# Patient Record
Sex: Male | Born: 1955 | Race: Black or African American | Hispanic: No | Marital: Married | Smoking: Never smoker
Health system: Southern US, Community
[De-identification: ages and names within clinical notes are randomized; demographics above are authoritative.]

## PROBLEM LIST (undated history)

## (undated) DIAGNOSIS — I10 Essential (primary) hypertension: Secondary | ICD-10-CM

## (undated) HISTORY — PX: HERNIA REPAIR: SHX51

## (undated) HISTORY — DX: Essential (primary) hypertension: I10

## (undated) HISTORY — PX: TOOTH EXTRACTION: SUR596

---

## 1998-07-28 ENCOUNTER — Emergency Department (HOSPITAL_COMMUNITY): Admission: EM | Admit: 1998-07-28 | Discharge: 1998-07-28 | Payer: Self-pay | Admitting: Emergency Medicine

## 1998-09-04 ENCOUNTER — Ambulatory Visit (HOSPITAL_COMMUNITY): Admission: RE | Admit: 1998-09-04 | Discharge: 1998-09-04 | Payer: Self-pay | Admitting: General Surgery

## 1999-08-16 ENCOUNTER — Encounter: Payer: Self-pay | Admitting: Family Medicine

## 1999-08-16 ENCOUNTER — Ambulatory Visit (HOSPITAL_COMMUNITY): Admission: RE | Admit: 1999-08-16 | Discharge: 1999-08-16 | Payer: Self-pay | Admitting: Family Medicine

## 2000-04-03 ENCOUNTER — Ambulatory Visit (HOSPITAL_COMMUNITY): Admission: RE | Admit: 2000-04-03 | Discharge: 2000-04-03 | Payer: Self-pay | Admitting: Family Medicine

## 2000-04-03 ENCOUNTER — Encounter: Payer: Self-pay | Admitting: Family Medicine

## 2010-03-01 ENCOUNTER — Encounter: Admission: RE | Admit: 2010-03-01 | Discharge: 2010-03-01 | Payer: Self-pay | Admitting: Family Medicine

## 2011-02-22 IMAGING — CR DG KNEE COMPLETE 4+V*R*
4 series · 4 of 4 positions shown · non-contrast
Comparison: None.

CLINICAL DATA: Knee pain, no acute injury

RIGHT KNEE - COMPLETE 4+ VIEW

[view not recorded (1 of 4)]
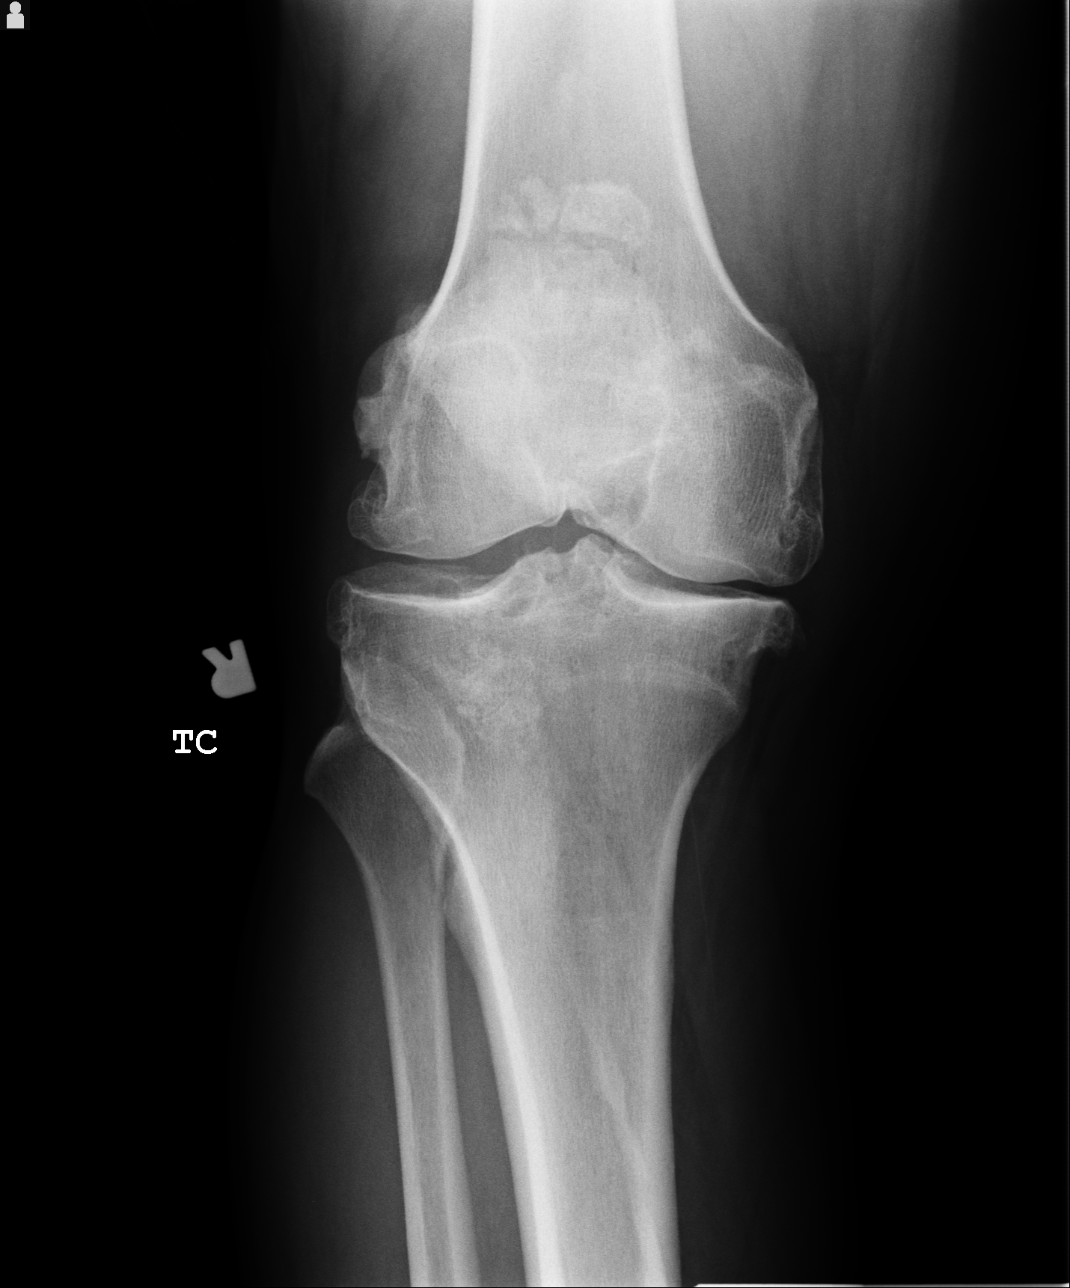

[view not recorded (2 of 4)]
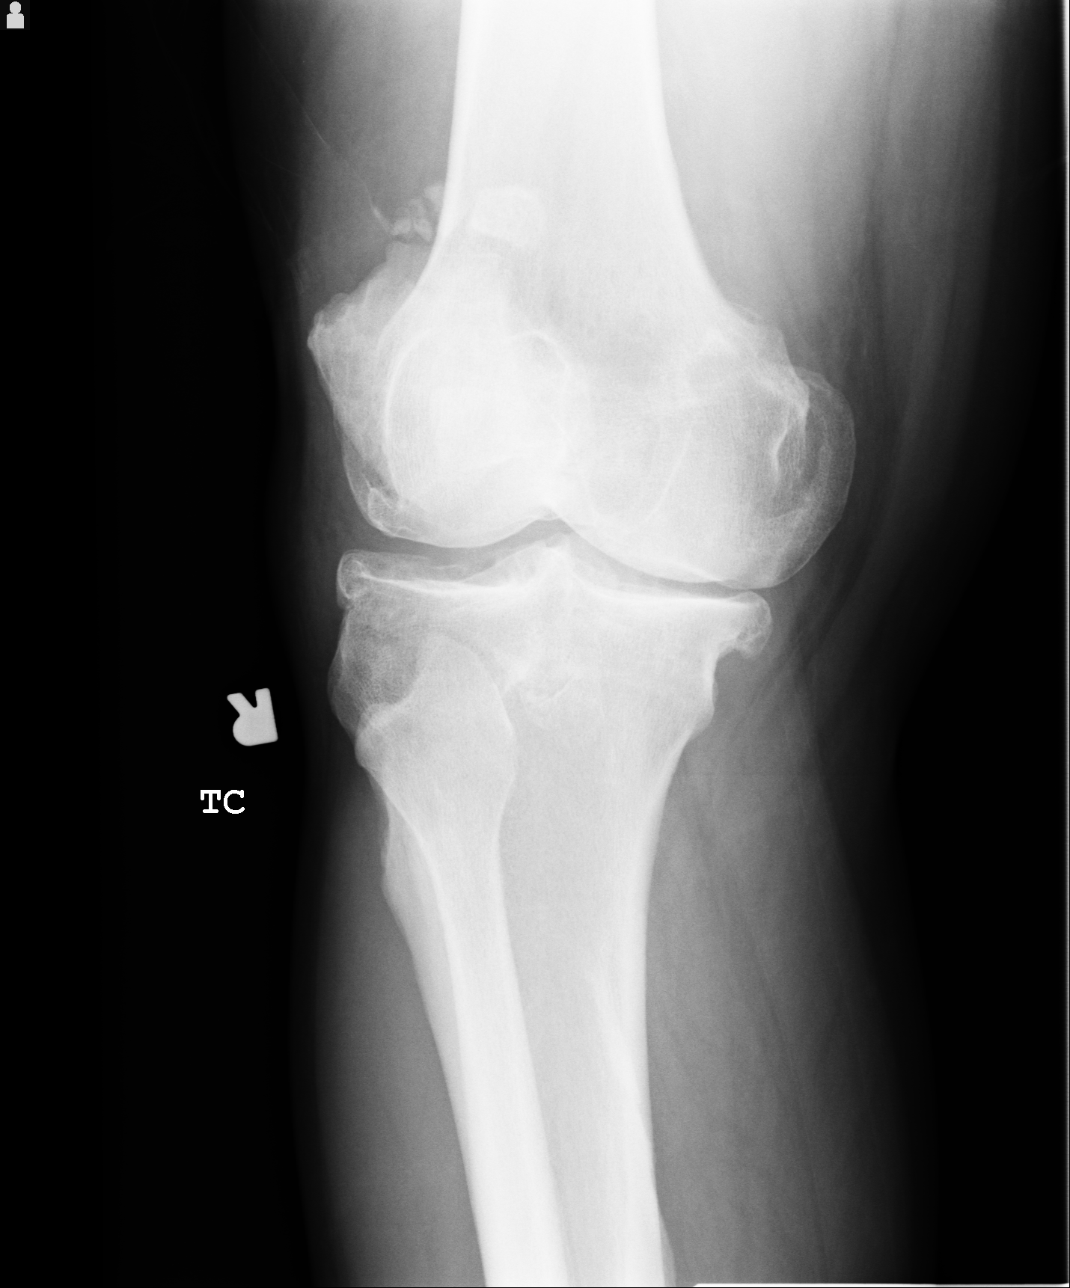

[view not recorded (3 of 4)]
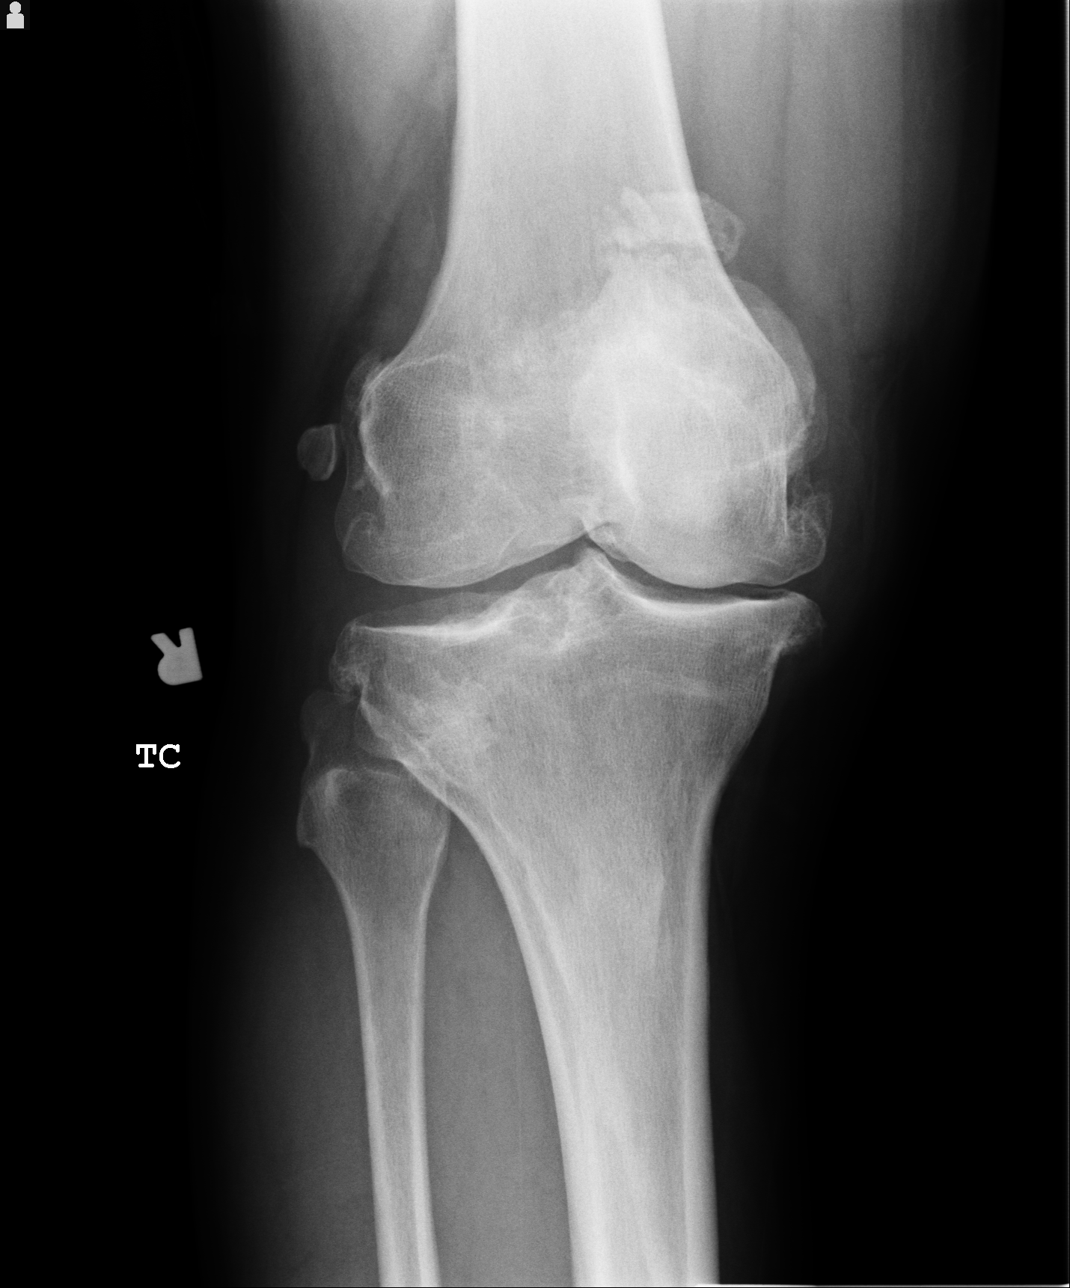

[view not recorded (4 of 4)]
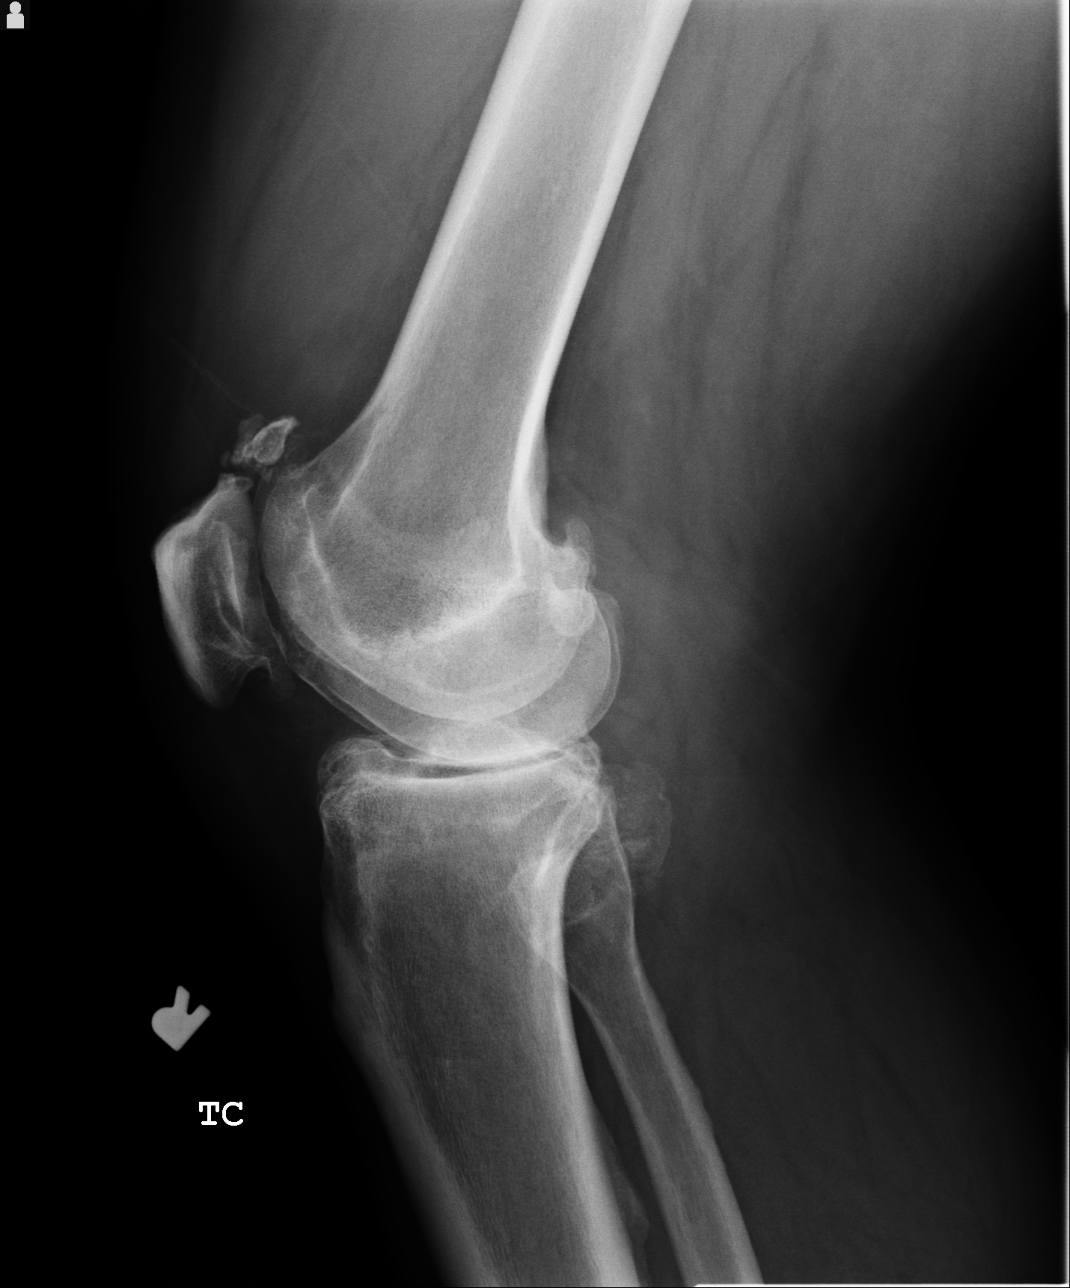

[4 of 4 positions shown; findings below may reference images not displayed]

FINDINGS: There is some degenerative joint disease particularly
involving the medial and patellofemoral compartments with loss of
joint space, sclerosis, and spurring.  No acute fracture is seen
and no definite joint effusion is noted.  Well corticated bony
densities lie just above the patella most likely due to prior
injury.
IMPRESSION: Bicompartmental degenerative joint disease.  No acute abnormality.

## 2014-05-16 ENCOUNTER — Encounter: Payer: Self-pay | Admitting: Internal Medicine

## 2014-08-02 ENCOUNTER — Encounter: Payer: Self-pay | Admitting: Internal Medicine

## 2018-11-09 ENCOUNTER — Ambulatory Visit (INDEPENDENT_AMBULATORY_CARE_PROVIDER_SITE_OTHER): Payer: PRIVATE HEALTH INSURANCE | Admitting: Neurology

## 2018-11-09 ENCOUNTER — Encounter: Payer: Self-pay | Admitting: Neurology

## 2018-11-09 DIAGNOSIS — G500 Trigeminal neuralgia: Secondary | ICD-10-CM

## 2018-11-09 MED ORDER — CARBAMAZEPINE 200 MG PO TABS: ORAL_TABLET | ORAL | 5 refills | Status: DC

## 2018-11-09 NOTE — Patient Instructions (Addendum)
You may have a condition called: Trigeminal Neuralgia, which is a facial pain condition, due to nerve irritation/inflammation.  We will do a brain scan, called MRI with special attention to the trigeminal nerve. We call you with the test results. We will have to schedule you for this on a separate date. This test requires authorization from your insurance, and we will take care of the insurance process.  We will check blood work today and call you with the test results.  We will start you on a medication called: Tegretol (carbamazepine) 200 mg strength: take 1/2 pill twice daily for 1 week, then 1 pill twice daily thereafter.  Side effects include sleepiness, daytime grogginess, balance problems, dizziness.   Thankfully, your neurological exam otherwise is fine.

## 2018-11-09 NOTE — Progress Notes (Signed)
Subjective:    Patient ID: Gary Prince is a 62 y.o. male.  HPI     [name], MD, PhD [facility] [address], [address] [address] [place], [place] [address]  Dear Dr. Ellery Plunk,   I saw your patient, Gary Prince, upon your kind request, in my neurologic clinic today for initial consultation of his left-sided facial pain. The patient is unaccompanied today. As you know, Gary Prince is a 62 year old right-handed gentleman with an underlying medical history of hypertension and mildly overweight state, who reports left upper facial pain for the past 2 months or so. He had a left upper maxilla molar tooth extraction under his dentist in September 2019. After his local anesthetic wore off he started having sharp pain in the left upper face and jaw area. Triggers included touching the area. He went back to his dentist and was treated symptomatically with ibuprofen 800 mg strength. He also had an additional procedure done to the underlying bone. His pain returned, was not as severe as originally. Nevertheless, he has ongoing sharp and shooting pains in the left mid face area, seems to radiate sideways towards the ear and upwards. Triggers include touch, toothbrushing, sometimes talking and chewing. He's had a course of steroid 62-year-old office. He did not find relief from this. The pain is intermittent, not constant. He has other teeth that need attention secondary to caries, he was told that there was no underlying bone disease or infection per x-rays. He does not have a history of headaches, he has no other neurological accompaniments. He is married and lives with his wife, he works at a daycare. He does not smoke, drinks alcohol about 8 drinks per week on average, drinks caffeine in the form of coffee, one cup per day on average. He tries to stay hydrated with water. He has a history of chronic knee pain from playing sports in high school. He has never seen an orthopedic specialist  for this.  His Past Medical History Is Significant For: Past Medical History:  Diagnosis Date  . Hypertension    His Past Surgical History Is Significant For:  His Family History Is Significant For: Family History  Problem Relation Age of Onset  . Stroke Mother   . Stroke Father     His Social History Is Significant For: Social History   Socioeconomic History  . Marital status: Married    Spouse name: Not on file  . Number of children: Not on file  . Years of education: Not on file  . Highest education level: Not on file  Occupational History  . Not on file  Social Needs  . Financial resource strain: Not on file  . Food insecurity:    Worry: Not on file    Inability: Not on file  . Transportation needs:    Medical: Not on file    Non-medical: Not on file  Tobacco Use  . Smoking status: Never Smoker  . Smokeless tobacco: Never Used  Substance and Sexual Activity  . Alcohol use: Yes    Alcohol/week: 8.0 standard drinks    Types: 8 Standard drinks or equivalent per week  . Drug use: Not on file  . Sexual activity: Not on file  Lifestyle  . Physical activity:    Days per week: Not on file    Minutes per session: Not on file  . Stress: Not on file  Relationships  . Social connections:    Talks on phone: Not on  file    Gets together: Not on file    Attends religious service: Not on file    Active member of club or organization: Not on file    Attends meetings of clubs or organizations: Not on file    Relationship status: Not on file  Other Topics Concern  . Not on file  Social History Narrative   1 CUP OF COFFEE DAILY   RIGHT HANDED    His Allergies Are:  No Known Allergies:   His Current Medications Are:  Outpatient Encounter Medications as of 11/09/2018  Medication Sig  . terbinafine (LAMISIL) 250 MG tablet Take 250 mg by mouth daily.  . traZODone (DESYREL) 50 MG tablet Take 50 mg by mouth daily.  . valsartan-hydrochlorothiazide (DIOVAN-HCT) 320-25  MG tablet Take 1 tablet by mouth daily.   No facility-administered encounter medications on file as of 11/09/2018.   :   Review of Systems:  Out of a complete 14 point review of systems, all are reviewed and negative with the exception of these symptoms as listed below: Review of Systems  Neurological:       Pt presents today to discuss his left facial pain following a tooth extraction 2 months ago. He has tried steroids and pain medication which has not helped.    Objective:  Neurological Exam  Physical Exam Physical Examination:   Vitals:   11/09/18 0824  BP: 126/84  Pulse: 77    General Examination: The patient is a very pleasant 62 y.o. male in no acute distress. He appears well-developed and well-nourished and well groomed.   HEENT: Normocephalic, atraumatic, pupils are equal, round and reactive to light and accommodation. xtraocular tracking is good without limitation to gaze excursion or nystagmus noted. Normal smooth pursuit is noted. Hearing is grossly intact. Tympanic membranes are clear bilaterally. Face is symmetric with normal facial animation and normal facial sensation , with the exception of slightly reduced temperature sense in the left mid face area. He triggered her pain sensation by tapping his face in front of the left ear. Speech is clear with no dysarthria noted. There is no hypophonia. There is no lip, neck/head, jaw or voice tremor. Neck is supple with full range of passive and active motion. There are no carotid bruits on auscultation. Oropharynx exam reveals: mild mouth dryness, marginal dental hygiene with several teeth missing and dental caries, well-healing area of tooth extraction left upper maxilla. Mallampati is class II. Tongue protrudes centrally and palate elevates symmetrically. Tonsils are absent.   Chest: Clear to auscultation without wheezing, rhonchi or crackles noted.  Heart: S1+S2+0, regular and normal without murmurs, rubs or gallops noted.    Abdomen: Soft, non-tender and non-distended with normal bowel sounds appreciated on auscultation.  Extremities: There is no pitting edema in the distal lower extremities bilaterally. Pedal pulses are intact.  Skin: Warm and dry without trophic changes noted.  Musculoskeletal: exam reveals no obvious joint deformities, tenderness or joint swelling or erythema.   Neurologically:  Mental status: The patient is awake, alert and oriented in all 4 spheres. His immediate and remote memory, attention, language skills and fund of knowledge are appropriate. There is no evidence of aphasia, agnosia, apraxia or anomia. Speech is clear with normal prosody and enunciation. Thought process is linear. Mood is normal and affect is normal.  Cranial nerves II - XII are as described above under HEENT exam. In addition: shoulder shrug is normal with equal shoulder height noted. Motor exam: Normal bulk, strength and tone  is noted. There is no drift, tremor or rebound. Romberg is negative. Reflexes are 2+ throughout. Fine motor skills and coordination: intact grossly.  Cerebellar testing: No dysmetria or intention tremor.  Sensory exam: intact to all modalities in the upper and lower extremities. Gait, station and balance: He stands easily. No veering to one side is noted. No leaning to one side is noted. Posture is age-appropriate and stance is narrow based. Gait shows normal stride length and normal pace. No problems turning are noted. Tandem walk is unremarkable.   Assessment and Plan:   In summary, Gary Prince is a very pleasant 62 y.o.-year old male with an underlying medical history of hypertension and mildly overweight state, who presents for evaluation of his intermittent left facial pain. 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 His neurological exam otherwise is nonfocal and he is reassured in that regard. I suggested that we proceed with a brain MRI with special attention  to the trigeminal nerve area to rule out any structural cause of his symptoms. I would like to also do some blood work to look for kidney, liver function, cell count, inflammatory markers etc. I would like for symptomatic treatment to start Tegretol low-dose 200 mg strength generic half a pill twice a day for the first week and then 1 pill twice a day thereafter. We talked about side effects, expectations and limitations of the medication. We will call him with his blood test results as well as scan results and I would like to see him back in 3 months, sooner if needed. He is encouraged to call with any interim questions or concerns you may have. I answered all his questions today and he was in agreement. Thank you very much for allowing me to participate in the care of this nice patient. If I can be of any further assistance to you please do not hesitate to call me at [phone].  Sincerely,   Huston FoleySaima Nathasha Fiorillo, MD, PhD

## 2018-11-10 ENCOUNTER — Telehealth: Payer: Self-pay

## 2018-11-10 LAB — CBC WITH DIFFERENTIAL/PLATELET
BASOS ABS: 0 10*3/uL (ref 0.0–0.2)
Basos: 0 %
EOS (ABSOLUTE): 0.2 10*3/uL (ref 0.0–0.4)
Eos: 2 %
HEMOGLOBIN: 15.5 g/dL (ref 13.0–17.7)
Hematocrit: 47.1 % (ref 37.5–51.0)
IMMATURE GRANS (ABS): 0.1 10*3/uL (ref 0.0–0.1)
Immature Granulocytes: 1 %
Lymphocytes Absolute: 1.5 10*3/uL (ref 0.7–3.1)
Lymphs: 18 %
MCH: 28.5 pg (ref 26.6–33.0)
MCHC: 32.9 g/dL (ref 31.5–35.7)
MCV: 87 fL (ref 79–97)
MONOCYTES: 8 %
Monocytes Absolute: 0.6 10*3/uL (ref 0.1–0.9)
Neutrophils Absolute: 5.9 10*3/uL (ref 1.4–7.0)
Neutrophils: 71 %
Platelets: 228 10*3/uL (ref 150–450)
RBC: 5.44 x10E6/uL (ref 4.14–5.80)
RDW: 13.6 % (ref 12.3–15.4)
WBC: 8.2 10*3/uL (ref 3.4–10.8)

## 2018-11-10 LAB — COMPREHENSIVE METABOLIC PANEL
ALT: 14 IU/L (ref 0–44)
AST: 13 IU/L (ref 0–40)
Albumin/Globulin Ratio: 1.9 (ref 1.2–2.2)
Albumin: 4.4 g/dL (ref 3.6–4.8)
Alkaline Phosphatase: 54 IU/L (ref 39–117)
BUN/Creatinine Ratio: 14 (ref 10–24)
BUN: 16 mg/dL (ref 8–27)
Bilirubin Total: 0.2 mg/dL (ref 0.0–1.2)
CO2: 26 mmol/L (ref 20–29)
Calcium: 9.7 mg/dL (ref 8.6–10.2)
Chloride: 99 mmol/L (ref 96–106)
Creatinine, Ser: 1.14 mg/dL (ref 0.76–1.27)
GFR calc Af Amer: 78 mL/min/{1.73_m2} (ref >59)
GFR calc non Af Amer: 68 mL/min/{1.73_m2} (ref >59)
Globulin, Total: 2.3 g/dL (ref 1.5–4.5)
Glucose: 74 mg/dL (ref 65–99)
Potassium: 4.3 mmol/L (ref 3.5–5.2)
Sodium: 140 mmol/L (ref 134–144)
Total Protein: 6.7 g/dL (ref 6.0–8.5)

## 2018-11-10 LAB — SEDIMENTATION RATE: Sed Rate: 8 mm/hr (ref 0–30)

## 2018-11-10 LAB — HGB A1C W/O EAG: Hgb A1c MFr Bld: 5.8 % — ABNORMAL HIGH (ref 4.8–5.6)

## 2018-11-10 LAB — C-REACTIVE PROTEIN: CRP: 1 mg/L (ref 0–10)

## 2018-11-10 NOTE — Progress Notes (Signed)
Labs okay, A1c is in the pre-diabetes range. Watching carbs and reducing sugar intake is recommended. Will proceed with MRI brain/trigem nerve. Please update pt. Gary Prince

## 2018-11-10 NOTE — Telephone Encounter (Signed)
I called pt and advised him of his lab results. Pt will monitor his carbs and reduce his sugar intake. Pt will be called to schedule his MRI. Pt verbalized understanding of results. Pt had no questions at this time but was encouraged to call back if questions arise.

## 2018-11-10 NOTE — Telephone Encounter (Signed)
-----   Message from Huston FoleySaima Athar, MD sent at 11/10/2018  7:44 AM EST ----- Labs okay, A1c is in the pre-diabetes range. Watching carbs and reducing sugar intake is recommended. Will proceed with MRI brain/trigem nerve. Please update pt. Gary Prince

## 2018-11-16 ENCOUNTER — Telehealth: Payer: Self-pay | Admitting: Neurology

## 2018-11-16 NOTE — Telephone Encounter (Signed)
[employer]ann [employer] auth:  it has been approved but they will not release the auth # becasue the inform department is trying to reach out to the patient to inform the patient where the cheapest places is for him to go to have the MRI. Right now the location for the MRI is [facility].

## 2019-02-15 ENCOUNTER — Ambulatory Visit (INDEPENDENT_AMBULATORY_CARE_PROVIDER_SITE_OTHER): Payer: PRIVATE HEALTH INSURANCE | Admitting: Neurology

## 2019-02-15 ENCOUNTER — Other Ambulatory Visit: Payer: Self-pay

## 2019-02-15 ENCOUNTER — Encounter: Payer: Self-pay | Admitting: Neurology

## 2019-02-15 VITALS — BP 138/91 | HR 82 | Ht 70.5 in | Wt 218.0 lb

## 2019-02-15 DIAGNOSIS — G500 Trigeminal neuralgia: Secondary | ICD-10-CM | POA: Diagnosis not present

## 2019-02-15 MED ORDER — CARBAMAZEPINE 200 MG PO TABS: 200.0000 mg | ORAL_TABLET | Freq: Two times a day (BID) | ORAL | 3 refills | Status: DC

## 2019-02-15 NOTE — Patient Instructions (Signed)
We will check blood work today and call you with the results.  We can continue with the generic tegretol 200 mg twice daily.  We will try to pursue the facial MRI and call you.  Please follow up in 6 months with Butch Penny, NP.

## 2019-02-15 NOTE — Progress Notes (Signed)
Subjective:    Patient ID: Gary Prince is a 63 y.o. male.  HPI     Interim history:   Gary Prince is a 63 year old right-handed gentleman with an underlying medical history of hypertension and mildly overweight state, who presents for follow-up consultation of his left-sided facial pain, concern for trigeminal neuralgia. The patient is unaccompanied today. I first met him on 11/09/2018 at the request of his dentist, at which time the patient reported left upper facial pain for the past 2 months or so. He had a recent left upper molar extraction in September 2019. I suggested we proceed with blood work, a trigeminal MRI and a trial of Tegretol. Blood work showed benign findings, including CRP, CBC, ESR, CMP, A1c however was borderline at 5.8. We talked to him about his blood test results via phone call. He did not proceed with a trigeminal MRI.  Today, 02/15/2019: He reports that he could not afford the MRI. He would be willing to try to pursue it at this time. He had a flareup in his left facial pain in or around Christmas. He has been taking Tegretol 200 mg twice a day generic. He reports no significant side effects but has noted a little bit of tiredness from it. He reports no issues driving. He avoids long-distance driving. He is supposed to see his dentist for additional dental work as I understand. Other than that, he has done well with the carbamazepine. He would like to continue with it.   The patient's allergies, current medications, family history, past medical history, past social history, past surgical history and problem list were reviewed and updated as appropriate.   Previously:   11/09/2018: (He) reports left upper facial pain for the past 2 months or so. He had a left upper maxilla molar tooth extraction under his dentist in September 2019. After his local anesthetic wore off he started having sharp pain in the left upper face and jaw area. Triggers included touching the area. He  went back to his dentist and was treated symptomatically with ibuprofen 800 mg strength. He also had an additional procedure done to the underlying bone. His pain returned, was not as severe as originally. Nevertheless, he has ongoing sharp and shooting pains in the left mid face area, seems to radiate sideways towards the ear and upwards. Triggers include touch, toothbrushing, sometimes talking and chewing. He's had a course of steroid 1-year-old office. He did not find relief from this. The pain is intermittent, not constant. He has other teeth that need attention secondary to caries, he was told that there was no underlying bone disease or infection per x-rays. He does not have a history of headaches, he has no other neurological accompaniments. He is married and lives with his wife, he works at a daycare. He does not smoke, drinks alcohol about 8 drinks per week on average, drinks caffeine in the form of coffee, one cup per day on average. He tries to stay hydrated with water. He has a history of chronic knee pain from playing sports in high school. He has never seen an orthopedic specialist for this.  His Past Medical History Is Significant For: Past Medical History:  Diagnosis Date  . Hypertension    His Past Surgical History Is Significant For:   His Family History Is Significant For: Family History  Problem Relation Age of Onset  . Stroke Mother   . Stroke Father     His Social History Is Significant For: Social History  Socioeconomic History  . Marital status: Married    Spouse name: Not on file  . Number of children: Not on file  . Years of education: Not on file  . Highest education level: Not on file  Occupational History  . Not on file  Social Needs  . Financial resource strain: Not on file  . Food insecurity:    Worry: Not on file    Inability: Not on file  . Transportation needs:    Medical: Not on file    Non-medical: Not on file  Tobacco Use  . Smoking status:  Never Smoker  . Smokeless tobacco: Never Used  Substance and Sexual Activity  . Alcohol use: Yes    Alcohol/week: 8.0 standard drinks    Types: 8 Standard drinks or equivalent per week  . Drug use: Not on file  . Sexual activity: Not on file  Lifestyle  . Physical activity:    Days per week: Not on file    Minutes per session: Not on file  . Stress: Not on file  Relationships  . Social connections:    Talks on phone: Not on file    Gets together: Not on file    Attends religious service: Not on file    Active member of club or organization: Not on file    Attends meetings of clubs or organizations: Not on file    Relationship status: Not on file  Other Topics Concern  . Not on file  Social History Narrative   1 CUP OF COFFEE DAILY   RIGHT HANDED    His Allergies Are:  No Known Allergies:   His Current Medications Are:  Outpatient Encounter Medications as of 02/15/2019  Medication Sig  . carbamazepine (TEGRETOL) 200 MG tablet 1/2 pill twice daily for 1 week, then 1 pill twice daily thereafter. (Patient taking differently: Take 200 mg by mouth 2 (two) times daily. 1/2 pill twice daily for 1 week, then 1 pill twice daily thereafter.)  . terbinafine (LAMISIL) 250 MG tablet Take 250 mg by mouth daily.  . traZODone (DESYREL) 50 MG tablet Take 50 mg by mouth daily.  . valsartan-hydrochlorothiazide (DIOVAN-HCT) 320-25 MG tablet Take 1 tablet by mouth daily.   No facility-administered encounter medications on file as of 02/15/2019.   :  Review of Systems:  Out of a complete 14 point review of systems, all are reviewed and negative with the exception of these symptoms as listed below:  Review of Systems  Neurological:       Pt presents today to discuss his trigeminal neuralgia. Pt reports that he could not afford the MRI. Pt is doing well on tegretol BID.    Objective:  Neurological Exam  Physical Exam Physical Examination:   Vitals:   02/15/19 0937  BP: (!) 138/91   Pulse: 82    General Examination: The patient is a very pleasant 63 y.o. male in no acute distress. He appears well-developed and well-nourished and well groomed.   HEENT: Normocephalic, atraumatic, pupils are equal, round and reactive to light.. Extraocular tracking is good without limitation to gaze excursion or nystagmus noted. Normal smooth pursuit is noted. Hearing is grossly intact. Face is symmetric with normal facial animation. Speech is clear with no dysarthria noted. There is no hypophonia. There is no lip, neck/head, jaw or voice tremor. Neck with FROM. Oropharynx exam reveals: mild mouth dryness, marginal dental hygiene with several teeth missing. Tongue protrudes centrally and palate elevates symmetrically. Tonsils are absent.  Chest: Clear to auscultation without wheezing, rhonchi or crackles noted.  Heart: S1+S2+0, regular and normal without murmurs, rubs or gallops noted.   Abdomen: Soft, non-tender and non-distended.  Extremities: There is no pitting edema in the distal lower extremities bilaterally. Pedal pulses are intact.  Skin: Warm and dry without trophic changes noted.  Musculoskeletal: exam reveals no obvious joint deformities, tenderness or joint swelling or erythema.   Neurologically:  Mental status: The patient is awake, alert and oriented in all 4 spheres. His immediate and remote memory, attention, language skills and fund of knowledge are appropriate. There is no evidence of aphasia, agnosia, apraxia or anomia. Speech is clear with normal prosody and enunciation. Thought process is linear. Mood is normal and affect is normal.  Cranial nerves II - XII are as described above under HEENT exam.  Motor exam: Normal bulk, strength and tone is noted. There is no tremor. Romberg is negative. Reflexes are 2+ throughout. Fine motor skills and coordination: intact grossly.  Cerebellar testing: No dysmetria or intention tremor.  Sensory exam: intact to all  modalities in the upper and lower extremities. Gait, station and balance: He stands easily. No veering to one side is noted. No leaning to one side is noted. Posture is age-appropriate and stance is narrow based. Gait shows normal stride length and normal pace. No problems turning are noted. Tandem walk is unremarkable.   Assessment and Plan:   In summary, Gary Prince is a very pleasant 63 year old male with an underlying medical history of hypertension and mildly overweight state, who presents for follow-up consultation of his left facial pain, concerning for left-sided trigeminal neuralgia. He has done fairly well with symptomatic treatment with carbamazepine 200 mg twice a day. He denies any side effects with the exception of some tiredness noted. He does not report any issues driving, avoids long-distance driving anyway. He had a flareup in or around Christmas he reports. He denies any other side effects. We will check blood work today in particular to monitor for any electrolyte disturbance as hyponatremia can sometimes be seen with carbamazepine use. In addition, we will look at his CBC with differential. I will also check a carbamazepine level. We will call him with his blood test results. He would be willing to pursue the trigeminal MRI. We will try to restart the authorization process. His physical exam is stable. I suggested a follow-up in 6 months with one of our nurse practitioners. I renewed his carbamazepine prescription for 90 days with refills. I answered all his questions today and he was in agreement. I spent 15 minutes in total face-to-face time with the patient, more than 50% of which was spent in counseling and coordination of care, reviewing test results, reviewing medication and discussing or reviewing the diagnosis of TGN, its prognosis and treatment options. Pertinent laboratory and imaging test results that were available during this visit with the patient were reviewed by me and  considered in my medical decision making (see chart for details).

## 2019-02-16 ENCOUNTER — Telehealth: Payer: Self-pay

## 2019-02-16 DIAGNOSIS — G500 Trigeminal neuralgia: Secondary | ICD-10-CM

## 2019-02-16 NOTE — Telephone Encounter (Signed)
I called pt to discuss his labs. No answer, left a message asking him to call me back. 

## 2019-02-16 NOTE — Progress Notes (Signed)
Blood count, sodium level, liver function were all good. He has abnormal kidney function. I am not sure, from what. Carbamazepine is very rare to cause kidney damage. We will await tegretol level, which is pending. Just 3 months ago, his kidney function was normal. Has he had a recent bladder infection or symptoms of bladder infection, history of kidney stones or did he start any other medication? We may have to stop the carbamazepine. Please call pt to review this.  [name]

## 2019-02-16 NOTE — Telephone Encounter (Signed)
-----   Message from [name], MD sent at 02/16/2019  8:09 AM EST ----- Blood count, sodium level, liver function were all good. He has abnormal kidney function. I am not sure, from what. Carbamazepine is very rare to cause kidney damage. We will await tegretol level, which is pending. Just 3 months ago, his kidney function was normal. Has he had a recent bladder infection or symptoms of bladder infection, history of kidney stones or did he start any other medication? We may have to stop the carbamazepine. Please call pt to review this.  [name]

## 2019-02-17 LAB — COMPREHENSIVE METABOLIC PANEL
ALBUMIN: 4.5 g/dL (ref 3.8–4.8)
ALT: 28 IU/L (ref 0–44)
AST: 15 IU/L (ref 0–40)
Albumin/Globulin Ratio: 2 (ref 1.2–2.2)
Alkaline Phosphatase: 59 IU/L (ref 39–117)
BUN/Creatinine Ratio: 12 (ref 10–24)
BUN: 17 mg/dL (ref 8–27)
Bilirubin Total: <0.2 mg/dL (ref 0.0–1.2)
CALCIUM: 9.3 mg/dL (ref 8.6–10.2)
CO2: 22 mmol/L (ref 20–29)
Chloride: 104 mmol/L (ref 96–106)
Creatinine, Ser: 1.43 mg/dL — ABNORMAL HIGH (ref 0.76–1.27)
GFR, EST AFRICAN AMERICAN: 59 mL/min/{1.73_m2} — AB (ref >59)
GFR, EST NON AFRICAN AMERICAN: 51 mL/min/{1.73_m2} — AB (ref >59)
Globulin, Total: 2.2 g/dL (ref 1.5–4.5)
Glucose: 85 mg/dL (ref 65–99)
POTASSIUM: 4 mmol/L (ref 3.5–5.2)
Sodium: 143 mmol/L (ref 134–144)
TOTAL PROTEIN: 6.7 g/dL (ref 6.0–8.5)

## 2019-02-17 LAB — CBC WITH DIFFERENTIAL/PLATELET
Basophils Absolute: 0 10*3/uL (ref 0.0–0.2)
Basos: 0 %
EOS (ABSOLUTE): 0.1 10*3/uL (ref 0.0–0.4)
EOS: 2 %
HEMATOCRIT: 45.8 % (ref 37.5–51.0)
HEMOGLOBIN: 14.8 g/dL (ref 13.0–17.7)
IMMATURE GRANULOCYTES: 1 %
Immature Grans (Abs): 0 10*3/uL (ref 0.0–0.1)
Lymphocytes Absolute: 1.7 10*3/uL (ref 0.7–3.1)
Lymphs: 21 %
MCH: 27.8 pg (ref 26.6–33.0)
MCHC: 32.3 g/dL (ref 31.5–35.7)
MCV: 86 fL (ref 79–97)
MONOCYTES: 8 %
Monocytes Absolute: 0.7 10*3/uL (ref 0.1–0.9)
NEUTROS PCT: 68 %
Neutrophils Absolute: 5.5 10*3/uL (ref 1.4–7.0)
Platelets: 202 10*3/uL (ref 150–450)
RBC: 5.33 x10E6/uL (ref 4.14–5.80)
RDW: 14 % (ref 11.6–15.4)
WBC: 8 10*3/uL (ref 3.4–10.8)

## 2019-02-17 LAB — CARBAMAZEPINE LEVEL, TOTAL: Carbamazepine (Tegretol), S: 11 ug/mL (ref 4.0–12.0)

## 2019-02-17 NOTE — Telephone Encounter (Signed)
Pt returned my call and I discussed his lab work with him. Pt denies any bladder infection or related symptoms, hx of kidney stones. Pt says that he has been taking lamisil but denies any other new medications. Pt reports that he doesn't think he is as hydrated as he should be. Pt will await further instructions from Dr. Frances Furbish regarding his tegretol.

## 2019-02-17 NOTE — Progress Notes (Signed)
Carbamazepine level within treatment goal range. Please reach out again re: abn. Kidney fnx. [name]

## 2019-02-17 NOTE — Telephone Encounter (Signed)
I called pt again to discuss his lab work. No answer, left another message asking him to call me back on his mobile number. Pt's home number is not available.

## 2019-02-17 NOTE — Telephone Encounter (Signed)
-----   Message from [name], MD sent at 02/17/2019  7:22 AM EST ----- Carbamazepine level within treatment goal range. Please reach out again re: abn. Kidney fnx. [name]

## 2019-02-17 NOTE — Telephone Encounter (Signed)
I called pt to discuss. No answer, left a message asking him to call me back. 

## 2019-02-17 NOTE — Telephone Encounter (Signed)
I would recommend that he schedule an appointment with his primary care physician to discuss further, I'm not sure that the Tegretol is the cause for the bump in the creatinine level. Nevertheless, we can check in a month from now with labs (CMP), if he is agreeable to continuing with the current dose we can and recheck labs in a month here. I would favor that he also discuss with his primary care physician other tests or options and her input.  The other option is to stop the Tegretol and recheck labs in a month. Please advise patient and he can decide which way he would like to go.

## 2019-02-19 NOTE — Telephone Encounter (Signed)
I called pt again to discuss. No answer, left a message asking him to call me back. 

## 2019-02-22 NOTE — Addendum Note (Signed)
Addended by: Geronimo Running A on: 02/22/2019 08:36 AM   Modules accepted: Orders

## 2019-02-22 NOTE — Telephone Encounter (Signed)
I called pt and discussed these options with him. Pt would prefer to speak with his PCP regarding his blood work, remain on tegretol, and come by [facility] next month for a recheck on his blood work. Pt is unsure of the exact date next month that he can come by but I discussed office hours with him. Pt will speak with Dr. Pecola Leisure and I will send a copy of his lab work to her. Pt verbalized understanding of recommendations. Pt had no questions at this time but was encouraged to call back if questions arise.

## 2019-02-25 ENCOUNTER — Telehealth: Payer: Self-pay | Admitting: Neurology

## 2019-02-25 NOTE — Telephone Encounter (Signed)
Patient is calling in requesting another order for his MRI to be sent to [facility] sue to him missing his appt

## 2019-02-26 ENCOUNTER — Telehealth: Payer: Self-pay | Admitting: Neurology

## 2019-02-26 DIAGNOSIS — G500 Trigeminal neuralgia: Secondary | ICD-10-CM

## 2019-02-26 MED ORDER — CARBAMAZEPINE 200 MG PO TABS: ORAL_TABLET | ORAL | 3 refills | Status: DC

## 2019-02-26 NOTE — Addendum Note (Signed)
Addended by: [name] on: 02/26/2019 11:52 AM   Modules accepted: Orders

## 2019-02-26 NOTE — Telephone Encounter (Signed)
I contacted the pt. He states the tegretol 200 mg is not providing benefit to he left sided facial pain. He was able to confirm he is taking 200 mg 1 tablet twice daily. He states the pain is on the inside of his mouth now where as before it was only on the outside. I provided the patient with two options.  I advised I could speak with the covering MD regarding this message or we could wait until Dr. Frances Furbish returned to the office on Monday to have this addressed. Pt was agreeable to waiting for Dr. Frances Furbish to review.

## 2019-02-26 NOTE — Telephone Encounter (Addendum)
I contacted the pt. He was agreeable with adding 1/2 pill of tegretol at midday and with the MRI face.  Pt states he has not been in touch with his PCP yet in regards to his kidney dysfunction, but advised me he would touch base soon.

## 2019-02-26 NOTE — Telephone Encounter (Signed)
Pt has called to inform that the carbamazepine (TEGRETOL) 200 MG tablet is no longer helping him and he would like a call back to discuss

## 2019-02-26 NOTE — Telephone Encounter (Signed)
We can increase the tegretol a little by adding a 1/2 pill midday.  I reordered MRI face.  Remind patient that we will need to recheck blood work. I also had asked pt to see PCP for kidney dysfunction, please inquire.

## 2019-03-01 NOTE — Telephone Encounter (Signed)
[employer] order sent to [facility]. They will obtain the auth and reach out to the pt to schedule.  °

## 2019-03-08 NOTE — Telephone Encounter (Signed)
Noted, this has been taking care of.

## 2019-03-09 ENCOUNTER — Other Ambulatory Visit (INDEPENDENT_AMBULATORY_CARE_PROVIDER_SITE_OTHER): Payer: Self-pay

## 2019-03-09 ENCOUNTER — Other Ambulatory Visit: Payer: Self-pay

## 2019-03-09 DIAGNOSIS — G500 Trigeminal neuralgia: Secondary | ICD-10-CM

## 2019-03-09 DIAGNOSIS — Z0289 Encounter for other administrative examinations: Secondary | ICD-10-CM

## 2019-03-09 NOTE — Telephone Encounter (Signed)
Rosann Auerbach auth: approved case # [id number] (exp. 03/08/19 to 06/06/19) patient is scheduled at [facility] for 03/12/19.

## 2019-03-09 NOTE — Telephone Encounter (Signed)
[employer] auth: approved case # [id number] (exp. 03/08/19 to 06/06/19) patient is scheduled at [facility] for 03/12/19. °

## 2019-03-10 ENCOUNTER — Telehealth: Payer: Self-pay | Admitting: *Deleted

## 2019-03-10 LAB — COMPREHENSIVE METABOLIC PANEL
A/G RATIO: 1.8 (ref 1.2–2.2)
ALT: 25 IU/L (ref 0–44)
AST: 14 IU/L (ref 0–40)
Albumin: 4.5 g/dL (ref 3.8–4.8)
Alkaline Phosphatase: 63 IU/L (ref 39–117)
BILIRUBIN TOTAL: 0.3 mg/dL (ref 0.0–1.2)
BUN/Creatinine Ratio: 13 (ref 10–24)
BUN: 13 mg/dL (ref 8–27)
CHLORIDE: 101 mmol/L (ref 96–106)
CO2: 23 mmol/L (ref 20–29)
Calcium: 9.4 mg/dL (ref 8.6–10.2)
Creatinine, Ser: 1.04 mg/dL (ref 0.76–1.27)
GFR calc non Af Amer: 75 mL/min/{1.73_m2} (ref >59)
GFR, EST AFRICAN AMERICAN: 87 mL/min/{1.73_m2} (ref >59)
GLOBULIN, TOTAL: 2.5 g/dL (ref 1.5–4.5)
Glucose: 92 mg/dL (ref 65–99)
POTASSIUM: 4.1 mmol/L (ref 3.5–5.2)
SODIUM: 142 mmol/L (ref 134–144)
Total Protein: 7 g/dL (ref 6.0–8.5)

## 2019-03-10 MED ORDER — GABAPENTIN 100 MG PO CAPS: 100.0000 mg | ORAL_CAPSULE | Freq: Every day | ORAL | 5 refills | Status: DC | PRN

## 2019-03-10 NOTE — Progress Notes (Signed)
Labs, including kidney function look good, continue current management, please notify pt. [name]

## 2019-03-10 NOTE — Telephone Encounter (Addendum)
Received call back from patient;  he had gotten VM about lab results. He stated the tegretol had made his facial pain better, but now the pain has shifted to his mouth near his nasal palate. He stated he has to try and eat on one side of his mouth due to the pain.  He is asking what he can do for the pain. I advised will let Dr Frances Furbish know and call him back. He verbalized understanding, appreciation.

## 2019-03-10 NOTE — Telephone Encounter (Signed)
LVM for patient informing him his labs, including kidney function look good. Dr Frances Furbish stated he should  continue current management and medications.  Left number for any questions.

## 2019-03-10 NOTE — Telephone Encounter (Signed)
Please call pt back:  We could potentially add as an adjunct gabapentin 100 mg strength one pill once daily in the morning, he can use this as needed. Please advise patient that it can potentially make him sleepy even at the lowest dose which is 100 mg.  Please verify that he is taking Tegretol generic 200 mg strength one pill morning and evening and half a pill midday at this point. I will send a prescription for gabapentin to his pharmacy on file.

## 2019-03-10 NOTE — Addendum Note (Signed)
Addended by: [name] on: 03/10/2019 01:50 PM   Modules accepted: Orders

## 2019-03-10 NOTE — Telephone Encounter (Signed)
LVM informing patient Dr Frances Furbish has sent in new Rx for gabapentin 100 mg to [facility], take one tab in morning. Advised it may make him sleepy even at lowest dose of 100 mg. We had confirmed  he is taking carbamazepine as prescribed in earlier call. Left number for any questions.

## 2019-03-12 ENCOUNTER — Other Ambulatory Visit: Payer: Self-pay

## 2019-04-05 ENCOUNTER — Other Ambulatory Visit: Payer: Self-pay | Admitting: Neurology

## 2019-05-04 ENCOUNTER — Inpatient Hospital Stay: Admission: RE | Admit: 2019-05-04 | Payer: Self-pay | Source: Ambulatory Visit

## 2019-08-18 ENCOUNTER — Ambulatory Visit: Payer: PRIVATE HEALTH INSURANCE | Admitting: Adult Health

## 2019-09-09 ENCOUNTER — Other Ambulatory Visit: Payer: Self-pay

## 2019-09-09 ENCOUNTER — Ambulatory Visit: Payer: PRIVATE HEALTH INSURANCE | Admitting: Adult Health

## 2019-09-09 ENCOUNTER — Telehealth: Payer: Self-pay

## 2019-09-09 NOTE — Telephone Encounter (Signed)
Patient was a no call/no show for their appointment today.   

## 2019-09-13 ENCOUNTER — Encounter: Payer: Self-pay | Admitting: Adult Health

## 2019-09-22 ENCOUNTER — Ambulatory Visit (INDEPENDENT_AMBULATORY_CARE_PROVIDER_SITE_OTHER): Payer: PRIVATE HEALTH INSURANCE | Admitting: Adult Health

## 2019-09-22 ENCOUNTER — Other Ambulatory Visit: Payer: Self-pay

## 2019-09-22 ENCOUNTER — Encounter: Payer: Self-pay | Admitting: Adult Health

## 2019-09-22 VITALS — BP 148/93 | HR 80 | Temp 98.0°F | Ht 70.0 in | Wt 219.2 lb

## 2019-09-22 DIAGNOSIS — G500 Trigeminal neuralgia: Secondary | ICD-10-CM

## 2019-09-22 DIAGNOSIS — Z5181 Encounter for therapeutic drug level monitoring: Secondary | ICD-10-CM | POA: Diagnosis not present

## 2019-09-22 MED ORDER — GABAPENTIN 100 MG PO CAPS: 100.0000 mg | ORAL_CAPSULE | Freq: Every day | ORAL | 3 refills | Status: DC | PRN

## 2019-09-22 NOTE — Progress Notes (Addendum)
PATIENT: Gary Prince DOB: [birth year]  REASON FOR VISIT: follow up HISTORY FROM: patient  HISTORY OF PRESENT ILLNESS: Today 09/22/19: Gary Prince is a 63 year old male with a history of trigeminal neuralgia.  He returns today for follow-up.  He is currently taking Tegretol 200 mg twice a day and gabapentin 100 mg at bedtime.  He reports that this is working well for him.  He has not had any flareups.  No side effects from his medications.  He does note that he has some ongoing numbness in the left pinky finger.  He states that it is been there for some time.  Reports that his PCP felt that it may be due to his high blood pressure.  Overall he feels that he is doing well.  He returns today for evaluation.  HISTORY (copied from Gary Prince note) 02/15/2019: He reports that he could not afford the MRI. He would be willing to try to pursue it at this time. He had a flareup in his left facial pain in or around Christmas. He has been taking Tegretol 200 mg twice a day generic. He reports no significant side effects but has noted a little bit of tiredness from it. He reports no issues driving. He avoids long-distance driving. He is supposed to see his dentist for additional dental work as I understand. Other than that, he has done well with the carbamazepine. He would like to continue with it.    REVIEW OF SYSTEMS: Out of a complete 14 system review of symptoms, the patient complains only of the following symptoms, and all other reviewed systems are negative.  See HPI  ALLERGIES: No Known Allergies  HOME MEDICATIONS: Outpatient Medications Prior to Visit  Medication Sig Dispense Refill  . carbamazepine (TEGRETOL) 200 MG tablet 1 pill morning and evening and 1/2 pill midday. 270 tablet 3  . gabapentin (NEURONTIN) 100 MG capsule TAKE 1 CAPSULE (100 MG TOTAL) BY MOUTH DAILY AS NEEDED. 90 capsule 2  . terbinafine (LAMISIL) 250 MG tablet Take 250 mg by mouth daily.  1  . traZODone (DESYREL) 50  MG tablet Take 50 mg by mouth daily.  2  . valsartan-hydrochlorothiazide (DIOVAN-HCT) 320-25 MG tablet Take 1 tablet by mouth daily.  2   No facility-administered medications prior to visit.     PAST MEDICAL HISTORY: Past Medical History:  Diagnosis Date  . Hypertension     PAST SURGICAL HISTORY:   FAMILY HISTORY: Family History  Problem Relation Age of Onset  . Stroke Mother   . Stroke Father     SOCIAL HISTORY: Social History   Socioeconomic History  . Marital status: Married    Spouse name: Not on file  . Number of children: Not on file  . Years of education: Not on file  . Highest education level: Not on file  Occupational History  . Not on file  Social Needs  . Financial resource strain: Not on file  . Food insecurity    Worry: Not on file    Inability: Not on file  . Transportation needs    Medical: Not on file    Non-medical: Not on file  Tobacco Use  . Smoking status: Never Smoker  . Smokeless tobacco: Never Used  Substance and Sexual Activity  . Alcohol use: Yes    Alcohol/week: 8.0 standard drinks    Types: 8 Standard drinks or equivalent per week  . Drug use: Not on file  . Sexual activity: Not on file  Lifestyle  . Physical activity    Days per week: Not on file    Minutes per session: Not on file  . Stress: Not on file  Relationships  . Social Musician on phone: Not on file    Gets together: Not on file    Attends religious service: Not on file    Active member of club or organization: Not on file    Attends meetings of clubs or organizations: Not on file    Relationship status: Not on file  . Intimate partner violence    Fear of current or ex partner: Not on file    Emotionally abused: Not on file    Physically abused: Not on file    Forced sexual activity: Not on file  Other Topics Concern  . Not on file  Social History Narrative   1 CUP OF COFFEE DAILY   RIGHT HANDED      PHYSICAL EXAM  Vitals:   09/22/19 1045   BP: (!) 148/93  Pulse: 80  Temp: 98 F (36.7 C)  Weight: 219 lb 3.2 oz (99.4 kg)  Height: 5\' 10"  (1.778 m)   Body mass index is 31.45 kg/m.  Generalized: Well developed, in no acute distress   Neurological examination  Mentation: Alert oriented to time, place, history taking. Follows all commands speech and language fluent Cranial nerve II-XII: Pupils were equal round reactive to light. Extraocular movements were full, visual field were full on confrontational test. Head turning and shoulder shrug  were normal and symmetric. Motor: The motor testing reveals 5 over 5 strength of all 4 extremities. Good symmetric motor tone is noted throughout.  Sensory: Sensory testing is intact to soft touch on all 4 extremities. No evidence of extinction is noted.  Coordination: Cerebellar testing reveals good finger-nose-finger and heel-to-shin bilaterally.  Gait and station: Gait is normal. Tandem gait is normal. Romberg is negative. No drift is seen.  Reflexes: Deep tendon reflexes are symmetric and normal bilaterally.   DIAGNOSTIC DATA (LABS, IMAGING, TESTING) - I reviewed patient records, labs, notes, testing and imaging myself where available.  Lab Results  Component Value Date   WBC 8.0 02/15/2019   HGB 14.8 02/15/2019   HCT 45.8 02/15/2019   MCV 86 02/15/2019   PLT 202 02/15/2019      Component Value Date/Time   NA 142 03/09/2019 1426   K 4.1 03/09/2019 1426   CL 101 03/09/2019 1426   CO2 23 03/09/2019 1426   GLUCOSE 92 03/09/2019 1426   BUN 13 03/09/2019 1426   CREATININE 1.04 03/09/2019 1426   CALCIUM 9.4 03/09/2019 1426   PROT 7.0 03/09/2019 1426   ALBUMIN 4.5 03/09/2019 1426   AST 14 03/09/2019 1426   ALT 25 03/09/2019 1426   ALKPHOS 63 03/09/2019 1426   BILITOT 0.3 03/09/2019 1426   GFRNONAA 75 03/09/2019 1426   GFRAA 87 03/09/2019 1426      ASSESSMENT AND PLAN 63 y.o. year old male  has a past medical history of Hypertension. here with:  1.  Trigeminal  neuralgia  Overall the patient is doing well.  He will continue on gabapentin and carbamazepine.  I will check blood work today.  He is advised that if his symptoms worsen or he develops new symptoms he should let 76 know.   I spent 15 minutes with the patient. 50% of this time was spent reviewing plan of care   [place], MSN, NP-C 09/22/2019, 10:58 AM [facility] [place], [place] [address] [phone]  I reviewed the above note and documentation by the Nurse Practitioner and agree with the history, exam, assessment and plan as outlined above. I was available for consultation. Star Age, MD, PhD [facility])

## 2019-09-22 NOTE — Patient Instructions (Signed)
Your Plan:  Continue gabapentin and Carbamazepine Blood work today If your symptoms worsen or you develop new symptoms please let us know.   Thank you for coming to see [place] at [facility]. I hope we have been able to provide you high quality care today.  You may receive a patient satisfaction survey over the next few weeks. We would appreciate your feedback and comments so that we may continue to improve ourselves and the health of our patients.

## 2019-09-23 ENCOUNTER — Telehealth: Payer: Self-pay | Admitting: *Deleted

## 2019-09-23 LAB — COMPREHENSIVE METABOLIC PANEL WITH GFR
ALT: 22 IU/L (ref 0–44)
AST: 20 IU/L (ref 0–40)
Albumin/Globulin Ratio: 1.9 (ref 1.2–2.2)
Albumin: 4.5 g/dL (ref 3.8–4.8)
Alkaline Phosphatase: 61 IU/L (ref 39–117)
BUN/Creatinine Ratio: 11 (ref 10–24)
BUN: 13 mg/dL (ref 8–27)
Bilirubin Total: 0.2 mg/dL (ref 0.0–1.2)
CO2: 26 mmol/L (ref 20–29)
Calcium: 9.3 mg/dL (ref 8.6–10.2)
Chloride: 103 mmol/L (ref 96–106)
Creatinine, Ser: 1.22 mg/dL (ref 0.76–1.27)
GFR calc Af Amer: 71 mL/min/1.73
GFR calc non Af Amer: 62 mL/min/1.73
Globulin, Total: 2.4 g/dL (ref 1.5–4.5)
Glucose: 71 mg/dL (ref 65–99)
Potassium: 4.4 mmol/L (ref 3.5–5.2)
Sodium: 142 mmol/L (ref 134–144)
Total Protein: 6.9 g/dL (ref 6.0–8.5)

## 2019-09-23 LAB — CBC WITH DIFFERENTIAL/PLATELET
Basophils Absolute: 0 10*3/uL (ref 0.0–0.2)
Basos: 0 %
EOS (ABSOLUTE): 0.1 10*3/uL (ref 0.0–0.4)
Eos: 2 %
Hematocrit: 47.7 % (ref 37.5–51.0)
Hemoglobin: 15.5 g/dL (ref 13.0–17.7)
Immature Grans (Abs): 0.1 10*3/uL (ref 0.0–0.1)
Immature Granulocytes: 1 %
Lymphocytes Absolute: 1.7 10*3/uL (ref 0.7–3.1)
Lymphs: 18 %
MCH: 28.1 pg (ref 26.6–33.0)
MCHC: 32.5 g/dL (ref 31.5–35.7)
MCV: 87 fL (ref 79–97)
Monocytes Absolute: 0.8 10*3/uL (ref 0.1–0.9)
Monocytes: 8 %
Neutrophils Absolute: 6.7 10*3/uL (ref 1.4–7.0)
Neutrophils: 71 %
Platelets: 181 10*3/uL (ref 150–450)
RBC: 5.51 x10E6/uL (ref 4.14–5.80)
RDW: 13.7 % (ref 11.6–15.4)
WBC: 9.4 10*3/uL (ref 3.4–10.8)

## 2019-09-23 LAB — CARBAMAZEPINE LEVEL, TOTAL: Carbamazepine (Tegretol), S: 4.4 ug/mL (ref 4.0–12.0)

## 2019-09-23 NOTE — Telephone Encounter (Signed)
-----   Message from Megan Millikan, NP sent at 09/23/2019  8:53 AM EDT ----- Labs results are unremarkable. Please call patient with results.   

## 2019-09-23 NOTE — Telephone Encounter (Signed)
I called pt and LMVM (protected line) that his lab results were normal.  He is to call back as needed.

## 2019-09-23 NOTE — Telephone Encounter (Signed)
-----   Message from Ward Givens, NP sent at 09/23/2019  8:53 AM EDT ----- Labs results are unremarkable. Please call patient with results.

## 2020-03-03 ENCOUNTER — Other Ambulatory Visit: Payer: Self-pay | Admitting: Family Medicine

## 2020-03-03 ENCOUNTER — Ambulatory Visit
Admission: RE | Admit: 2020-03-03 | Discharge: 2020-03-03 | Disposition: A | Discharge status: Home / Self Care | Payer: 59 | Source: Ambulatory Visit | Attending: Family Medicine | Admitting: Family Medicine

## 2020-03-03 DIAGNOSIS — R52 Pain, unspecified: Secondary | ICD-10-CM

## 2020-03-12 ENCOUNTER — Other Ambulatory Visit: Payer: Self-pay | Admitting: Neurology

## 2020-04-19 ENCOUNTER — Telehealth: Payer: Self-pay | Admitting: Adult Health

## 2020-04-19 DIAGNOSIS — G500 Trigeminal neuralgia: Secondary | ICD-10-CM

## 2020-04-19 NOTE — Telephone Encounter (Signed)
Noted, [employer] order sent to [facility]. They will obtain the auth and reach out to the patient to schedule.

## 2020-04-19 NOTE — Addendum Note (Signed)
Addended by: Guy Begin on: 04/19/2020 10:35 AM   Modules accepted: Orders

## 2020-04-19 NOTE — Telephone Encounter (Signed)
Pt states due to the Pandemic he never got around to getting his MRI last year, pt is asking if another order can be submitted for him to be able to get the MRI as soon as possible.

## 2020-04-19 NOTE — Telephone Encounter (Signed)
Okay to reorder MRI trigem nerve

## 2020-04-20 ENCOUNTER — Telehealth: Payer: Self-pay | Admitting: Adult Health

## 2020-04-20 NOTE — Telephone Encounter (Signed)
lmvm for pt to return call.  

## 2020-04-20 NOTE — Telephone Encounter (Signed)
Pt is asking that something be called in for his nerve pain.  Please call to discuss

## 2020-05-04 ENCOUNTER — Telehealth: Payer: Self-pay | Admitting: Neurology

## 2020-05-04 NOTE — Telephone Encounter (Signed)
Phone rep checked office voicemail's,at 10:08 pt left voicemail re: him not being able to wait until June for his MRI due to his mouth pain.  Pt stated he is unable to eat and that he has lost about 10 pounds, please call.

## 2020-05-04 NOTE — Telephone Encounter (Signed)
I called pt. No answer, left a message asking pt to call me back.  Pt last was seen by Mry Lamia m.. If pt calls back please offer him an appt to discuss facial pain. His last visit was 09/22/2019.

## 2020-05-04 NOTE — Telephone Encounter (Signed)
Patient walked in stating he is having extreme facial pain. Could not get thru on the phones. Wants a call back [phone]

## 2020-05-09 ENCOUNTER — Other Ambulatory Visit: Payer: Self-pay

## 2020-05-09 ENCOUNTER — Ambulatory Visit (INDEPENDENT_AMBULATORY_CARE_PROVIDER_SITE_OTHER): Payer: 59 | Admitting: Adult Health

## 2020-05-09 ENCOUNTER — Encounter: Payer: Self-pay | Admitting: Adult Health

## 2020-05-09 VITALS — BP 142/94 | HR 77 | Ht 65.0 in | Wt 206.0 lb

## 2020-05-09 DIAGNOSIS — G500 Trigeminal neuralgia: Secondary | ICD-10-CM

## 2020-05-09 NOTE — Patient Instructions (Signed)
Your Plan:  Take Carbamazepine 1 tablet in the AM, 1/2 tablet midday and 1 tablet at night If this is not beneficial then increase gabapentin to 1 tablet three times a day If your symptoms worsen or you develop new symptoms please let us know.    Thank you for coming to see [place] at [facility]. I hope we have been able to provide you high quality care today.  You may receive a patient satisfaction survey over the next few weeks. We would appreciate your feedback and comments so that we may continue to improve ourselves and the health of our patients.

## 2020-05-09 NOTE — Progress Notes (Addendum)
PATIENT: Gary Prince DOB: 1956-10-09  REASON FOR VISIT: follow up HISTORY FROM: patient  HISTORY OF PRESENT ILLNESS: Today 05/09/20:  Gary Prince  is a 64 year old male with a history of trigeminal neuralgia on the left.  He returns today for follow-up.  He states that starting 3 weeks ago he began to have discomfort again in his face.  Reports that movement seems to make it worse.  Reports that hot and cold food seems to exacerbate the pain as well.  His prescription of carbamazepine is written to take 1 tablet in the morning, half a tablet midday and 1 tablet at bedtime.  He reports that he has been only taking 1 tablet in the morning and half a tablet at bedtime.  He remains on gabapentin at bedtime.  His MRI is scheduled for June 5.  HISTORY 09/22/19: Gary Prince is a 64 year old male with a history of trigeminal neuralgia.  He returns today for follow-up.  He is currently taking Tegretol 200 mg twice a day and gabapentin 100 mg at bedtime.  He reports that this is working well for him.  He has not had any flareups.  No side effects from his medications.  He does note that he has some ongoing numbness in the left pinky finger.  He states that it is been there for some time.  Reports that his PCP felt that it may be due to his high blood pressure.  Overall he feels that he is doing well.  He returns today for evaluation  REVIEW OF SYSTEMS: Out of a complete 14 system review of symptoms, the patient complains only of the following symptoms, and all other reviewed systems are negative.  See HPI  ALLERGIES: No Known Allergies  HOME MEDICATIONS: Outpatient Medications Prior to Visit  Medication Sig Dispense Refill  . carbamazepine (TEGRETOL) 200 MG tablet 1 PILL MORNING AND EVENING AND 1/2 PILL MIDDAY. 225 tablet 1  . gabapentin (NEURONTIN) 100 MG capsule Take 1 capsule (100 mg total) by mouth daily as needed. 90 capsule 3  . terbinafine (LAMISIL) 250 MG tablet Take 250 mg by mouth  daily.  1  . traZODone (DESYREL) 50 MG tablet Take 50 mg by mouth daily.  2  . valsartan-hydrochlorothiazide (DIOVAN-HCT) 320-25 MG tablet Take 1 tablet by mouth daily.  2   No facility-administered medications prior to visit.    PAST MEDICAL HISTORY: Past Medical History:  Diagnosis Date  . Hypertension     PAST SURGICAL HISTORY: Past Surgical History:  Procedure Laterality Date  . HERNIA REPAIR    . TOOTH EXTRACTION      FAMILY HISTORY: Family History  Problem Relation Age of Onset  . Stroke Mother   . Stroke Father     SOCIAL HISTORY: Social History   Socioeconomic History  . Marital status: Married    Spouse name: Not on file  . Number of children: Not on file  . Years of education: Not on file  . Highest education level: Not on file  Occupational History  . Not on file  Tobacco Use  . Smoking status: Never Smoker  . Smokeless tobacco: Never Used  Substance and Sexual Activity  . Alcohol use: Yes    Alcohol/week: 8.0 standard drinks    Types: 8 Standard drinks or equivalent per week  . Drug use: Not on file  . Sexual activity: Not on file  Other Topics Concern  . Not on file  Social History Narrative   1  CUP OF COFFEE DAILY   RIGHT HANDED   Social Determinants of Health   Financial Resource Strain:   . Difficulty of Paying Living Expenses:   Food Insecurity:   . Worried About Charity fundraiser in the Last Year:   . Arboriculturist in the Last Year:   Transportation Needs:   . Film/video editor (Medical):   Marland Kitchen Lack of Transportation (Non-Medical):   Physical Activity:   . Days of Exercise per Week:   . Minutes of Exercise per Session:   Stress:   . Feeling of Stress :   Social Connections:   . Frequency of Communication with Friends and Family:   . Frequency of Social Gatherings with Friends and Family:   . Attends Religious Services:   . Active Member of Clubs or Organizations:   . Attends Archivist Meetings:   Marland Kitchen Marital  Status:   Intimate Partner Violence:   . Fear of Current or Ex-Partner:   . Emotionally Abused:   Marland Kitchen Physically Abused:   . Sexually Abused:       PHYSICAL EXAM  Vitals:   05/09/20 0853  BP: (!) 142/94  Pulse: 77  Weight: 206 lb (93.4 kg)  Height: 5\' 5"  (1.651 m)   Body mass index is 34.28 kg/m.  Generalized: Well developed, in no acute distress   Neurological examination  Mentation: Alert oriented to time, place, history taking. Follows all commands speech and language fluent Cranial nerve II-XII: Pupils were equal round reactive to light. Extraocular movements were full, visual field were full on confrontational test.  Head turning and shoulder shrug  were normal and symmetric. Motor: The motor testing reveals 5 over 5 strength of all 4 extremities. Good symmetric motor tone is noted throughout.  Sensory: Sensory testing is intact to soft touch on all 4 extremities. No evidence of extinction is noted.  Coordination: Cerebellar testing reveals good finger-nose-finger and heel-to-shin bilaterally.  Gait and station: Gait is normal. Reflexes: Deep tendon reflexes are symmetric and normal bilaterally.   DIAGNOSTIC DATA (LABS, IMAGING, TESTING) - I reviewed patient records, labs, notes, testing and imaging myself where available.  Lab Results  Component Value Date   WBC 9.4 09/22/2019   HGB 15.5 09/22/2019   HCT 47.7 09/22/2019   MCV 87 09/22/2019   PLT 181 09/22/2019      Component Value Date/Time   NA 142 09/22/2019 1116   K 4.4 09/22/2019 1116   CL 103 09/22/2019 1116   CO2 26 09/22/2019 1116   GLUCOSE 71 09/22/2019 1116   BUN 13 09/22/2019 1116   CREATININE 1.22 09/22/2019 1116   CALCIUM 9.3 09/22/2019 1116   PROT 6.9 09/22/2019 1116   ALBUMIN 4.5 09/22/2019 1116   AST 20 09/22/2019 1116   ALT 22 09/22/2019 1116   ALKPHOS 61 09/22/2019 1116   BILITOT 0.2 09/22/2019 1116   GFRNONAA 62 09/22/2019 1116   GFRAA 71 09/22/2019 1116   No results found for:  CHOL, HDL, LDLCALC, LDLDIRECT, TRIG, CHOLHDL Lab Results  Component Value Date   HGBA1C 5.8 (H) 11/09/2018   No results found for: VITAMINB12 No results found for: TSH    ASSESSMENT AND PLAN 64 y.o. year old male  has a past medical history of Hypertension. here with :  Trigeminal neuralgia on the left   Increase carbamazepine to take it as written:1 tablet in the morning and in the evening and one half a tablet midday  Advised that after several  days if he is not seeing any improvement in his discomfort he can increase gabapentin to 1 tablet 3 times a day  Advised if his symptoms do not improve he should let us know.  I spent 30 minutes of face-to-face and non-face-to-face time with patient.  This included previsit chart review, lab review, study review, order entry, electronic health record documentation, patient education.  Butch Penny, MSN, NP-C 05/09/2020, 8:53 AM [facility] [address] [place], [place] [address] [phone]  I reviewed the above note and documentation by the Nurse Practitioner and agree with the history, exam, assessment and plan as outlined above. I was available for consultation. [name], MD, PhD [facility])

## 2020-05-20 ENCOUNTER — Inpatient Hospital Stay: Admission: RE | Admit: 2020-05-20 | Payer: 59 | Source: Ambulatory Visit

## 2020-05-22 NOTE — Telephone Encounter (Signed)
Andrey Campanile can you call and schedule this for this afternoon at 330 or tomorrow at 330

## 2020-05-22 NOTE — Telephone Encounter (Signed)
Gary Prince did not approve the MRI face trigeminal.  "Based on guidelines face pain/ trigeminal neuralgia, we cannot approve this request. Your records show that you have nerve pain in your face. The reason this request cannot be approved is because. 1. Your doctor must suspect or have concerns for one of the following. Pain coming for your cranial nerves (12 pairs of nerves that carry messages from your brain). Multiple sclerosis (a disease involving damage to the membranes or coverings of nerves cells in the brain and spinal cord) that has not been diagnosed. Pain coming from your trigeminal cranial nerves (the fifth pair of nerves that carry messages from your brain) after post-herpetic neuralgia (a complication of shingles) has been excluded as source.  It does need to be scheduled but they informed me that there is not deadline it just needs to be scheduled. The case number is [id number]. The phone is [phone].

## 2020-05-23 NOTE — Telephone Encounter (Signed)
I called and made appt for 05-24-20 At 1530 with Patrecia Pour for peer to peer MRI denial.

## 2020-05-24 ENCOUNTER — Telehealth: Payer: Self-pay | Admitting: Adult Health

## 2020-05-24 NOTE — Telephone Encounter (Signed)
Noted, order sent to [facility] . They will contact the patient to r/s.

## 2020-05-24 NOTE — Telephone Encounter (Signed)
Spoke to Dr. Patrecia Pour. Scan approved.   Approval number: [id number]

## 2020-05-24 NOTE — Telephone Encounter (Signed)
Pt has called back and phone rep advised him that Megan,NP is scheduled for a peer to peer this afternoon and depending on the outcome of that he will either be called by Delmer Islam or Irving Burton.  Pt will wait to be contacted.

## 2020-05-25 NOTE — Telephone Encounter (Signed)
I left a voicemail informing the patient that Aundra Millet did a p2p and was able to get the MRI approved and that I sent the order back to [facility] for them to contact him to get the MRI r/s.. if he didn't hear from them their number is [phone].

## 2020-05-29 NOTE — Telephone Encounter (Signed)
Rutherford Nail: [id number] (exp. 05/18/20 to 08/16/20) patient is scheduled at [facility] for 06/06/20.

## 2020-06-06 ENCOUNTER — Ambulatory Visit
Admission: RE | Admit: 2020-06-06 | Discharge: 2020-06-06 | Disposition: A | Discharge status: Home / Self Care | Payer: 59 | Source: Ambulatory Visit | Attending: Neurology | Admitting: Neurology

## 2020-06-06 DIAGNOSIS — G500 Trigeminal neuralgia: Secondary | ICD-10-CM | POA: Diagnosis not present

## 2020-06-06 MED ORDER — GADOBENATE DIMEGLUMINE 529 MG/ML IV SOLN
20.0000 mL | Freq: Once | INTRAVENOUS | Status: AC | PRN
  Administered 2020-06-06: 20 mL via INTRAVENOUS

## 2020-06-08 ENCOUNTER — Telehealth: Payer: Self-pay

## 2020-06-08 NOTE — Progress Notes (Signed)
Please call patient and advise him that his recent brain MRI with and without contrast with special attention to the trigeminal nerve was reported as normal.  He can keep his follow-up appointment as scheduled.

## 2020-06-08 NOTE — Telephone Encounter (Signed)
I contacted the pt and left vm advising of result ( ok per dpr). I explained to the pt that I was calling from a blocked # due to our phone lines being down. I advised the pt he could plan on calling the office back next week if he had any question. I stated we did not have an estimate restore time for our phones as of yet.

## 2020-06-08 NOTE — Telephone Encounter (Signed)
-----   Message from [name], MD sent at 06/08/2020  1:17 PM EDT ----- Please call patient and advise him that his recent brain MRI with and without contrast with special attention to the trigeminal nerve was reported as normal.  He can keep his follow-up appointment as scheduled.

## 2020-06-12 ENCOUNTER — Telehealth: Payer: Self-pay | Admitting: Adult Health

## 2020-06-12 NOTE — Telephone Encounter (Signed)
Patient called asking to speak to [facility] NP or RN about pain.

## 2020-06-12 NOTE — Telephone Encounter (Signed)
Called pt and was not able to speak to him.  He is still having pain,  Trying to get appt with MM/NP sooner.  I placed in my cancellation book,  He can call back to discuss if needs med adjustment.

## 2020-06-12 NOTE — Telephone Encounter (Signed)
Gary Prince, This Ot had an MRI last week and he was trying to call to get an appt. He has facial nerve pain and wanted to come in ASAP. I was unable to get an appt with Megan and Dr. Frances Furbish had as soonest 7.22.21 so I scheduled him. He said he is taking way to much of the medication prescribed and he is concerned with the pain. His phone has issues so it may lose the call but if you leave him a voicemail he will call you back. Thank you

## 2020-06-12 NOTE — Telephone Encounter (Signed)
If patient calls back we can do a med adjustment: First to ensure that he is taking the medication as prescribed and tolerating it well Carbamazepine 1 tablet in the morning and in the evening and one half a tablet midday Gabapentin 1 tablet 3 times a day   If he is taking the medication as prescribed and tolerating it well he can increase gabapentin 100 mg to 2 tablets 3 times a day

## 2020-06-12 NOTE — Telephone Encounter (Signed)
Patient states CB # is [phone].

## 2020-06-22 ENCOUNTER — Other Ambulatory Visit: Payer: Self-pay

## 2020-06-22 ENCOUNTER — Ambulatory Visit (INDEPENDENT_AMBULATORY_CARE_PROVIDER_SITE_OTHER): Payer: 59 | Admitting: Adult Health

## 2020-06-22 ENCOUNTER — Encounter: Payer: Self-pay | Admitting: Adult Health

## 2020-06-22 VITALS — BP 143/95 | HR 63 | Ht 65.0 in | Wt 215.4 lb

## 2020-06-22 DIAGNOSIS — G500 Trigeminal neuralgia: Secondary | ICD-10-CM | POA: Diagnosis not present

## 2020-06-22 NOTE — Telephone Encounter (Signed)
I called pt.  He is taking he states carbamazepine and gabapentin taking 2 tabs each  In  morning, 2 tabs each  at 6-7pm , and 2 tabs each at qhs.   He is taking the increase on his own due to his pain.  I made appt for him at 1500 today to discuss worsening TN. (arrival 1430.  He verbalized understanding.

## 2020-06-22 NOTE — Progress Notes (Addendum)
PATIENT: Gary Prince DOB: 09-11-1956  REASON FOR VISIT: follow up HISTORY FROM: patient  HISTORY OF PRESENT ILLNESS: Today 06/22/20:  Gary Prince is a 64 year old male with a history of trigeminal neuralgia on the left.  He returns today for follow-up.  We recently increased his gabapentin to 200 mg 3 times a day.  He reports that since the increase his facial pain has improved.  He continues on Tegretol.  He returns today for an evaluation.  HISTORY 05/09/20:  Gary Prince  is a 64 year old male with a history of trigeminal neuralgia on the left.  He returns today for follow-up.  He states that starting 3 weeks ago he began to have discomfort again in his face.  Reports that movement seems to make it worse.  Reports that hot and cold food seems to exacerbate the pain as well.  His prescription of carbamazepine is written to take 1 tablet in the morning, half a tablet midday and 1 tablet at bedtime.  He reports that he has been only taking 1 tablet in the morning and half a tablet at bedtime.  He remains on gabapentin at bedtime.  His MRI is scheduled for June 5.  REVIEW OF SYSTEMS: Out of a complete 14 system review of symptoms, the patient complains only of the following symptoms, and all other reviewed systems are negative.  See HPI  ALLERGIES: No Known Allergies  HOME MEDICATIONS: Outpatient Medications Prior to Visit  Medication Sig Dispense Refill  . carbamazepine (TEGRETOL) 200 MG tablet 1 PILL MORNING AND EVENING AND 1/2 PILL MIDDAY. 225 tablet 1  . gabapentin (NEURONTIN) 100 MG capsule Take 1 capsule (100 mg total) by mouth daily as needed. 90 capsule 3  . terbinafine (LAMISIL) 250 MG tablet Take 250 mg by mouth daily.  1  . traZODone (DESYREL) 50 MG tablet Take 50 mg by mouth daily.  2  . valsartan-hydrochlorothiazide (DIOVAN-HCT) 320-25 MG tablet Take 1 tablet by mouth daily.  2   No facility-administered medications prior to visit.    PAST MEDICAL HISTORY: Past  Medical History:  Diagnosis Date  . Hypertension     PAST SURGICAL HISTORY: Past Surgical History:  Procedure Laterality Date  . HERNIA REPAIR    . TOOTH EXTRACTION      FAMILY HISTORY: Family History  Problem Relation Age of Onset  . Stroke Mother   . Stroke Father     SOCIAL HISTORY: Social History   Socioeconomic History  . Marital status: Married    Spouse name: Not on file  . Number of children: Not on file  . Years of education: Not on file  . Highest education level: Not on file  Occupational History  . Not on file  Tobacco Use  . Smoking status: Never Smoker  . Smokeless tobacco: Never Used  Substance and Sexual Activity  . Alcohol use: Yes    Alcohol/week: 8.0 standard drinks    Types: 8 Standard drinks or equivalent per week  . Drug use: Not on file  . Sexual activity: Not on file  Other Topics Concern  . Not on file  Social History Narrative   1 CUP OF COFFEE DAILY   RIGHT HANDED   Social Determinants of Health   Financial Resource Strain:   . Difficulty of Paying Living Expenses:   Food Insecurity:   . Worried About Programme researcher, broadcasting/film/video in the Last Year:   . [employer] of Food in the Last Year:  Transportation Needs:   . Freight forwarder (Medical):   Marland Kitchen Lack of Transportation (Non-Medical):   Physical Activity:   . Days of Exercise per Week:   . Minutes of Exercise per Session:   Stress:   . Feeling of Stress :   Social Connections:   . Frequency of Communication with Friends and Family:   . Frequency of Social Gatherings with Friends and Family:   . Attends Religious Services:   . Active Member of Clubs or Organizations:   . Attends Banker Meetings:   Marland Kitchen Marital Status:   Intimate Partner Violence:   . Fear of Current or Ex-Partner:   . Emotionally Abused:   Marland Kitchen Physically Abused:   . Sexually Abused:       PHYSICAL EXAM  Vitals:   06/22/20 1444  BP: (!) 143/95  Pulse: 63  Weight: 215 lb 6.4 oz (97.7 kg)    Height: 5\' 5"  (1.651 m)   Body mass index is 35.84 kg/m.  Generalized: Well developed, in no acute distress   Neurological examination  Mentation: Alert oriented to time, place, history taking. Follows all commands speech and language fluent Cranial nerve II-XII: Pupils were equal round reactive to light. Extraocular movements were full, visual field were full on confrontational test. Facial sensation and strength were normal. Uvula tongue midline. Head turning and shoulder shrug  were normal and symmetric. Motor: The motor testing reveals 5 over 5 strength of all 4 extremities. Good symmetric motor tone is noted throughout.  Sensory: Sensory testing is intact to soft touch on all 4 extremities. No evidence of extinction is noted.  Coordination: Cerebellar testing reveals good finger-nose-finger and heel-to-shin bilaterally.  Gait and station: Gait is normal. Tandem gait is normal. Romberg is negative. No drift is seen.  Reflexes: Deep tendon reflexes are symmetric and normal bilaterally.   DIAGNOSTIC DATA (LABS, IMAGING, TESTING) - I reviewed patient records, labs, notes, testing and imaging myself where available.  Lab Results  Component Value Date   WBC 9.4 09/22/2019   HGB 15.5 09/22/2019   HCT 47.7 09/22/2019   MCV 87 09/22/2019   PLT 181 09/22/2019      Component Value Date/Time   NA 142 09/22/2019 1116   K 4.4 09/22/2019 1116   CL 103 09/22/2019 1116   CO2 26 09/22/2019 1116   GLUCOSE 71 09/22/2019 1116   BUN 13 09/22/2019 1116   CREATININE 1.22 09/22/2019 1116   CALCIUM 9.3 09/22/2019 1116   PROT 6.9 09/22/2019 1116   ALBUMIN 4.5 09/22/2019 1116   AST 20 09/22/2019 1116   ALT 22 09/22/2019 1116   ALKPHOS 61 09/22/2019 1116   BILITOT 0.2 09/22/2019 1116   GFRNONAA 62 09/22/2019 1116   GFRAA 71 09/22/2019 1116   No results found for: CHOL, HDL, LDLCALC, LDLDIRECT, TRIG, CHOLHDL Lab Results  Component Value Date   HGBA1C 5.8 (H) 11/09/2018      ASSESSMENT  AND PLAN 64 y.o. year old male  has a past medical history of Hypertension. here with:  1.  Trigeminal neuralgia on the left   Continue gabapentin 200 mg 3 times a day.  Continue Tegretol  Follow-up in 6 months or sooner if needed   I spent 20 minutes of face-to-face and non-face-to-face time with patient.  This included previsit chart review, lab review, study review, order entry, electronic health record documentation, patient education.  71, MSN, NP-C 06/22/2020, 3:12 PM [facility] [address], [address] [place], [place]  [address] [phone]  I reviewed the above note and documentation by the Nurse Practitioner and agree with the history, exam, assessment and plan as outlined above. I was available for consultation. [name], MD, PhD [facility])

## 2020-06-22 NOTE — Patient Instructions (Signed)
Your Plan:  Continue gabapentin and tegretol  If your symptoms worsen or you develop new symptoms please let us know.   Thank you for coming to see [place] at [facility]. I hope we have been able to provide you high quality care today.  You may receive a patient satisfaction survey over the next few weeks. We would appreciate your feedback and comments so that we may continue to improve ourselves and the health of our patients.

## 2020-07-05 ENCOUNTER — Telehealth: Payer: Self-pay

## 2020-07-05 NOTE — Telephone Encounter (Signed)
I contacted the pt and left a vm.  Pt is on the books for an appt with Dr. Frances Furbish for 07/06/2020 at 830 for nerve/facial pain. Pt was just evaluated by MM, NP on 06/22/2020 and this issue was addressed.  Lvm ask pt if he would like to keep this appt or cancel.  Pt was advised to call back if he would like to cancel.

## 2020-07-06 ENCOUNTER — Ambulatory Visit: Payer: 59 | Admitting: Neurology

## 2020-07-17 ENCOUNTER — Telehealth: Payer: Self-pay | Admitting: Adult Health

## 2020-07-17 DIAGNOSIS — G500 Trigeminal neuralgia: Secondary | ICD-10-CM

## 2020-07-17 MED ORDER — CARBAMAZEPINE 200 MG PO TABS: ORAL_TABLET | ORAL | 3 refills | Status: DC

## 2020-07-17 MED ORDER — CARBAMAZEPINE 200 MG PO TABS: ORAL_TABLET | ORAL | 5 refills | Status: DC

## 2020-07-17 MED ORDER — GABAPENTIN 100 MG PO CAPS: 200.0000 mg | ORAL_CAPSULE | Freq: Three times a day (TID) | ORAL | 5 refills | Status: DC

## 2020-07-17 NOTE — Addendum Note (Signed)
Addended by: Guy Begin on: 07/17/2020 04:51 PM   Modules accepted: Orders

## 2020-07-17 NOTE — Telephone Encounter (Signed)
Spoke to pt , he thought that carbamazepine was the one he was increasing.  I relayed that gabapentin was increased per MM/NP 200mg  po TID.  He was taking carbamzaepine 400mg  po am, afternoon and qhs.  And the gabapentin 100mg  po tid.  He is out of carbamzaepine.

## 2020-07-17 NOTE — Addendum Note (Signed)
Addended by: Guy Begin on: 07/17/2020 05:10 PM   Modules accepted: Orders

## 2020-07-17 NOTE — Telephone Encounter (Signed)
I called pt and LMVM for him that will get CMP and carbamazepine level.  Take dose of carbamazepine as ordered.  Did try to renew with prescription stating ok to renew, but willhave to se what insurance will allow.

## 2020-07-17 NOTE — Telephone Encounter (Signed)
Pt request refill for carbamazepine (TEGRETOL) 200 MG tablet at [facility] [phone]

## 2020-07-17 NOTE — Telephone Encounter (Signed)
Pt called again stating he is out of the medication and is wanting to know if this can be called in today for him. Please advise.

## 2020-07-18 ENCOUNTER — Other Ambulatory Visit: Payer: Self-pay

## 2020-07-18 ENCOUNTER — Other Ambulatory Visit (INDEPENDENT_AMBULATORY_CARE_PROVIDER_SITE_OTHER): Payer: Self-pay

## 2020-07-18 ENCOUNTER — Other Ambulatory Visit: Payer: Self-pay | Admitting: Adult Health

## 2020-07-18 DIAGNOSIS — G500 Trigeminal neuralgia: Secondary | ICD-10-CM

## 2020-07-18 DIAGNOSIS — Z0289 Encounter for other administrative examinations: Secondary | ICD-10-CM

## 2020-07-18 DIAGNOSIS — Z5181 Encounter for therapeutic drug level monitoring: Secondary | ICD-10-CM

## 2020-07-18 NOTE — Telephone Encounter (Addendum)
Pt came in to the office for lab work.  He was not able to get carbamazepine, I will have to call insurance and get override.  He did pick up the gabapentin.  I wrote out instructions on how to take both gabapentin 200mg  po tid, and carbamazepine 200mg  AM and PM, 100mg  midday.  He verbalized understanding.  Will call with lab results.  He is to hydrate well.  I called pharmacy spoke with .  Did get overide and she did call pharmacy.  30 day supply carbamazepine.  [phone].  phamacy having issues with override, but stated she would stay on to help them so pt will be able to get the medication today.

## 2020-07-19 LAB — CARBAMAZEPINE LEVEL, TOTAL: Carbamazepine (Tegretol), S: 3.4 ug/mL — ABNORMAL LOW (ref 4.0–12.0)

## 2020-07-20 ENCOUNTER — Telehealth: Payer: Self-pay

## 2020-07-20 NOTE — Telephone Encounter (Signed)
Pt verified by name and DOB, results given per provider, pt voiced understanding all question answered. 

## 2020-07-20 NOTE — Telephone Encounter (Signed)
-----   Message from Butch Penny, NP sent at 07/20/2020 11:22 AM EDT ----- Labs results are unremarkable. Please call patient with results.

## 2020-07-24 ENCOUNTER — Telehealth: Payer: Self-pay | Admitting: Adult Health

## 2020-07-24 NOTE — Telephone Encounter (Signed)
Pt left voicemail stating he is still having pain in his mouth, only able to eat on 1 side.  Pt would like a call to discuss going up on his carbamazepine (TEGRETOL) 200 MG tablet.  Please call

## 2020-07-24 NOTE — Telephone Encounter (Signed)
Pt taking 200-100-200 carbamazepine, then gabapentin 200mg  po tid.  Increase?

## 2020-07-25 MED ORDER — GABAPENTIN 100 MG PO CAPS: 300.0000 mg | ORAL_CAPSULE | Freq: Three times a day (TID) | ORAL | 5 refills | Status: DC

## 2020-07-25 NOTE — Telephone Encounter (Signed)
No prescription escribed, dose just changed.

## 2020-07-25 NOTE — Addendum Note (Signed)
Addended by: Guy Begin on: 07/25/2020 02:29 PM   Modules accepted: Orders

## 2020-07-25 NOTE — Telephone Encounter (Addendum)
Called pt and relayed that he is to keep carbamazepine same (no change from the way he is taking it).  Increase the gabapentin 300mg  po tid(100mg  caps) Pt did verbalized understanding.

## 2020-07-25 NOTE — Telephone Encounter (Signed)
If patient is amenable I recommend that he increase gabapentin to 300 mg 3 times a day

## 2020-08-02 ENCOUNTER — Telehealth: Payer: Self-pay | Admitting: Adult Health

## 2020-08-02 MED ORDER — CARBAMAZEPINE 200 MG PO TABS: 200.0000 mg | ORAL_TABLET | Freq: Three times a day (TID) | ORAL | 5 refills | Status: DC

## 2020-08-02 MED ORDER — GABAPENTIN 400 MG PO CAPS: 400.0000 mg | ORAL_CAPSULE | Freq: Three times a day (TID) | ORAL | 5 refills | Status: DC

## 2020-08-02 NOTE — Telephone Encounter (Signed)
Called pt and relayed per Dr. Frances Furbish to increase carbamazepine 200mg  po TID and gabapentin 400mg  po TID, both updated to pharmacy.  Call in 2 wks to let know how he is doing.  Pt verbalized understanding and will let us know how he does.

## 2020-08-02 NOTE — Telephone Encounter (Signed)
Received another message from pt that his medications need to be changed as the gabapentin/ carbamazepine is not working.  He is taking carbamazepine 200mg  100mg  200mg  and gabapentin 300mg  po tid.  He stated no change in pain control at all.  MM/NP is out of office and message sent to Dr. .

## 2020-08-02 NOTE — Telephone Encounter (Signed)
Pt called stating his carbamazepine (TEGRETOL) 200 MG tablet is not working for him. He states that he is still having pain in his mouth when he talks, eat's and drink's. Pt would like to discuss what can be done to help with this pain with the provider or RN. Please advise.

## 2020-08-02 NOTE — Telephone Encounter (Signed)
I think we should try the following changes: Increase the Tegretol to 200 mg 3 times daily. I would also like to suggest an increase in the gabapentin to 400 mg 3 times daily.  I made respective changes to his prescriptions and send them to his pharmacy.  Please advise patient of the proposed changes and have him keep [place] updated by phone call in the next couple of weeks.

## 2020-08-02 NOTE — Addendum Note (Signed)
Addended by: [name] on: 08/02/2020 12:19 PM   Modules accepted: Orders

## 2020-08-02 NOTE — Telephone Encounter (Signed)
See previous message

## 2020-08-07 NOTE — Telephone Encounter (Signed)
Pt left a VM reporting that he was unable to pick up his RXat the pharmacy. It will be $70. He has further questions.

## 2020-08-08 NOTE — Telephone Encounter (Signed)
I called pt and he was able to get the medication yesterday at the pharmacy.  Nothing needed.  He will call back as needed.

## 2020-09-25 ENCOUNTER — Other Ambulatory Visit: Payer: Self-pay | Admitting: Adult Health

## 2020-09-25 ENCOUNTER — Ambulatory Visit: Payer: PRIVATE HEALTH INSURANCE | Admitting: Adult Health

## 2020-10-30 NOTE — Progress Notes (Addendum)
PATIENT: Gary Prince DOB: 11-Jan-1956  REASON FOR VISIT: follow up HISTORY FROM: patient  HISTORY OF PRESENT ILLNESS: Today 10/30/20:  Gary Prince is a 64 year old male with a history of trigeminal neuralgia on the left.  He returns today for follow-up.  He is currently taking gabapentin 400 mg 3 times a day and Tegretol 200 mg 3 times a day.  He states that he has not noticed any pain in the left side of the face.  Overall he has been doing well.  He returns today for follow-up.  HISTORY 06/22/20:  Gary Prince is a 64 year old male with a history of trigeminal neuralgia on the left.  He returns today for follow-up.  We recently increased his gabapentin to 200 mg 3 times a day.  He reports that since the increase his facial pain has improved.  He continues on Tegretol.  He returns today for an evaluation.  HISTORY 05/09/20:  Gary Prince a 64 year old male with a history of trigeminal neuralgia on the left. He returns today for follow-up. He states that starting 3 weeks ago he began to have discomfort again in his face. Reports that movement seems to make it worse. Reports that hot and cold food seems to exacerbate the pain as well. His prescription of carbamazepine is written to take 1 tablet in the morning, half a tablet midday and 1 tablet at bedtime. He reports that he has been only taking 1 tablet in the morning and half a tablet at bedtime. He remains on gabapentin at bedtime. His MRI is scheduled for June 5.   REVIEW OF SYSTEMS: Out of a complete 14 system review of symptoms, the patient complains only of the following symptoms, and all other reviewed systems are negative.  See HPI  ALLERGIES: No Known Allergies  HOME MEDICATIONS: Outpatient Medications Prior to Visit  Medication Sig Dispense Refill  . carbamazepine (TEGRETOL) 200 MG tablet Take 1 tablet (200 mg total) by mouth 3 (three) times daily. 90 tablet 5  . gabapentin (NEURONTIN) 400 MG capsule Take 1  capsule (400 mg total) by mouth 3 (three) times daily. 90 capsule 5  . terbinafine (LAMISIL) 250 MG tablet Take 250 mg by mouth daily.  1  . traZODone (DESYREL) 50 MG tablet Take 50 mg by mouth daily.  2  . valsartan-hydrochlorothiazide (DIOVAN-HCT) 320-25 MG tablet Take 1 tablet by mouth daily.  2   No facility-administered medications prior to visit.    PAST MEDICAL HISTORY: Past Medical History:  Diagnosis Date  . Hypertension     PAST SURGICAL HISTORY: Past Surgical History:  Procedure Laterality Date  . HERNIA REPAIR    . TOOTH EXTRACTION      FAMILY HISTORY: Family History  Problem Relation Age of Onset  . Stroke Mother   . Stroke Father     SOCIAL HISTORY: Social History   Socioeconomic History  . Marital status: Married    Spouse name: Not on file  . Number of children: Not on file  . Years of education: Not on file  . Highest education level: Not on file  Occupational History  . Not on file  Tobacco Use  . Smoking status: Never Smoker  . Smokeless tobacco: Never Used  Substance and Sexual Activity  . Alcohol use: Yes    Alcohol/week: 8.0 standard drinks    Types: 8 Standard drinks or equivalent per week  . Drug use: Not on file  . Sexual activity: Not on file  Other Topics  Concern  . Not on file  Social History Narrative   1 CUP OF COFFEE DAILY   RIGHT HANDED   Social Determinants of Health   Financial Resource Strain:   . Difficulty of Paying Living Expenses: Not on file  Food Insecurity:   . Worried About Programme researcher, broadcasting/film/video in the Last Year: Not on file  . Ran Out of Food in the Last Year: Not on file  Transportation Needs:   . Lack of Transportation (Medical): Not on file  . Lack of Transportation (Non-Medical): Not on file  Physical Activity:   . Days of Exercise per Week: Not on file  . Minutes of Exercise per Session: Not on file  Stress:   . Feeling of Stress : Not on file  Social Connections:   . Frequency of Communication with  Friends and Family: Not on file  . Frequency of Social Gatherings with Friends and Family: Not on file  . Attends Religious Services: Not on file  . Active Member of Clubs or Organizations: Not on file  . Attends Banker Meetings: Not on file  . Marital Status: Not on file  Intimate Partner Violence:   . Fear of Current or Ex-Partner: Not on file  . Emotionally Abused: Not on file  . Physically Abused: Not on file  . Sexually Abused: Not on file      PHYSICAL EXAM  Vitals:   10/31/20 1030  BP: 124/80  Pulse: 81  Weight: 220 lb (99.8 kg)  Height: 5\' 5"  (1.651 m)   Body mass index is 36.61 kg/m.  Generalized: Well developed, in no acute distress   Neurological examination  Mentation: Alert oriented to time, place, history taking. Follows all commands speech and language fluent Cranial nerve II-XII: Pupils were equal round reactive to light. Extraocular movements were full, visual field were full on confrontational test. Facial sensation and strength were normal. Uvula tongue midline. Head turning and shoulder shrug  were normal and symmetric. Motor: The motor testing reveals 5 over 5 strength of all 4 extremities. Good symmetric motor tone is noted throughout.  Sensory: Sensory testing is intact to soft touch on all 4 extremities. No evidence of extinction is noted.  Coordination: Cerebellar testing reveals good finger-nose-finger and heel-to-shin bilaterally.  Gait and station: Gait is normal.  Reflexes: Deep tendon reflexes are symmetric and normal bilaterally.   DIAGNOSTIC DATA (LABS, IMAGING, TESTING) - I reviewed patient records, labs, notes, testing and imaging myself where available.  Lab Results  Component Value Date   WBC 9.4 09/22/2019   HGB 15.5 09/22/2019   HCT 47.7 09/22/2019   MCV 87 09/22/2019   PLT 181 09/22/2019      Component Value Date/Time   NA 142 09/22/2019 1116   K 4.4 09/22/2019 1116   CL 103 09/22/2019 1116   CO2 26 09/22/2019  1116   GLUCOSE 71 09/22/2019 1116   BUN 13 09/22/2019 1116   CREATININE 1.22 09/22/2019 1116   CALCIUM 9.3 09/22/2019 1116   PROT 6.9 09/22/2019 1116   ALBUMIN 4.5 09/22/2019 1116   AST 20 09/22/2019 1116   ALT 22 09/22/2019 1116   ALKPHOS 61 09/22/2019 1116   BILITOT 0.2 09/22/2019 1116   GFRNONAA 62 09/22/2019 1116   GFRAA 71 09/22/2019 1116    Lab Results  Component Value Date   HGBA1C 5.8 (H) 11/09/2018      ASSESSMENT AND PLAN 64 y.o. year old male  has a past medical history of  Hypertension. here with:  1.  Trigeminal neuralgia  -Continue gabapentin 400 mg 3 times a day -Continue Tegretol 200 mg 3 times a day -Advised if his symptoms worsen or he develops new symptoms he should let us know -Follow-up in 1 year or sooner if needed   I spent 25 minutes of face-to-face and non-face-to-face time with patient.  This included previsit chart review, lab review, study review, order entry, electronic health record documentation, patient education.  Butch Penny, MSN, NP-C 10/30/2020, 5:16 PM [facility] [address] [place], [place] [address] [phone]   I reviewed the above note and documentation by the Nurse Practitioner and agree with the history, exam, assessment and plan as outlined above. I was available for consultation. [name], MD, PhD  [facility])

## 2020-10-31 ENCOUNTER — Ambulatory Visit (INDEPENDENT_AMBULATORY_CARE_PROVIDER_SITE_OTHER): Payer: 59 | Admitting: Adult Health

## 2020-10-31 ENCOUNTER — Encounter: Payer: Self-pay | Admitting: Adult Health

## 2020-10-31 VITALS — BP 124/80 | HR 81 | Ht 65.0 in | Wt 220.0 lb

## 2020-10-31 DIAGNOSIS — G500 Trigeminal neuralgia: Secondary | ICD-10-CM

## 2020-10-31 DIAGNOSIS — Z5181 Encounter for therapeutic drug level monitoring: Secondary | ICD-10-CM | POA: Diagnosis not present

## 2020-10-31 MED ORDER — CARBAMAZEPINE 200 MG PO TABS: 200.0000 mg | ORAL_TABLET | Freq: Three times a day (TID) | ORAL | 11 refills | Status: DC

## 2020-10-31 MED ORDER — GABAPENTIN 400 MG PO CAPS: 400.0000 mg | ORAL_CAPSULE | Freq: Three times a day (TID) | ORAL | 11 refills | Status: DC

## 2020-10-31 NOTE — Patient Instructions (Signed)
Your Plan:  Continue gabapentin 400 mg 3 times a day Continue Tegretol 200 mg 3 times a day If your symptoms worsen or you develop new symptoms please let us know.   Thank you for coming to see [place] at [facility]. I hope we have been able to provide you high quality care today.  You may receive a patient satisfaction survey over the next few weeks. We would appreciate your feedback and comments so that we may continue to improve ourselves and the health of our patients.

## 2020-11-20 ENCOUNTER — Other Ambulatory Visit: Payer: Self-pay | Admitting: Adult Health

## 2020-11-20 DIAGNOSIS — G500 Trigeminal neuralgia: Secondary | ICD-10-CM

## 2020-11-20 NOTE — Progress Notes (Signed)
Please have patient complete Blood work. I have placed orders. He can come at his convenience

## 2020-11-21 ENCOUNTER — Telehealth: Payer: Self-pay | Admitting: *Deleted

## 2020-11-21 NOTE — Telephone Encounter (Signed)
Called pt and relayed to him to come in for labs that MM/NP requested (CBC/CMP, Carbamazepine level.. come m-th 8-12, 1330-1700.  He verbalized understanding.

## 2020-12-12 ENCOUNTER — Other Ambulatory Visit (INDEPENDENT_AMBULATORY_CARE_PROVIDER_SITE_OTHER): Payer: Self-pay

## 2020-12-12 DIAGNOSIS — Z0289 Encounter for other administrative examinations: Secondary | ICD-10-CM

## 2020-12-12 DIAGNOSIS — G500 Trigeminal neuralgia: Secondary | ICD-10-CM

## 2020-12-13 LAB — CBC WITH DIFFERENTIAL/PLATELET
Basophils Absolute: 0 10*3/uL (ref 0.0–0.2)
Basos: 1 %
EOS (ABSOLUTE): 0 10*3/uL (ref 0.0–0.4)
Eos: 0 %
Hematocrit: 47.6 % (ref 37.5–51.0)
Hemoglobin: 15.5 g/dL (ref 13.0–17.7)
Immature Grans (Abs): 0.1 10*3/uL (ref 0.0–0.1)
Immature Granulocytes: 1 %
Lymphocytes Absolute: 2.5 10*3/uL (ref 0.7–3.1)
Lymphs: 29 %
MCH: 28.4 pg (ref 26.6–33.0)
MCHC: 32.6 g/dL (ref 31.5–35.7)
MCV: 87 fL (ref 79–97)
Monocytes Absolute: 0.6 10*3/uL (ref 0.1–0.9)
Monocytes: 8 %
Neutrophils Absolute: 5.3 10*3/uL (ref 1.4–7.0)
Neutrophils: 61 %
Platelets: 202 10*3/uL (ref 150–450)
RBC: 5.45 x10E6/uL (ref 4.14–5.80)
RDW: 13.8 % (ref 11.6–15.4)
WBC: 8.5 10*3/uL (ref 3.4–10.8)

## 2020-12-13 LAB — COMPREHENSIVE METABOLIC PANEL
ALT: 15 IU/L (ref 0–44)
AST: 13 IU/L (ref 0–40)
Albumin/Globulin Ratio: 1.9 (ref 1.2–2.2)
Albumin: 4.7 g/dL (ref 3.8–4.8)
Alkaline Phosphatase: 66 IU/L (ref 44–121)
BUN/Creatinine Ratio: 16 (ref 10–24)
BUN: 20 mg/dL (ref 8–27)
Bilirubin Total: <0.2 mg/dL (ref 0.0–1.2)
CO2: 22 mmol/L (ref 20–29)
Calcium: 9.8 mg/dL (ref 8.6–10.2)
Chloride: 100 mmol/L (ref 96–106)
Creatinine, Ser: 1.29 mg/dL — ABNORMAL HIGH (ref 0.76–1.27)
GFR calc Af Amer: 66 mL/min/{1.73_m2} (ref >59)
GFR calc non Af Amer: 57 mL/min/{1.73_m2} — ABNORMAL LOW (ref >59)
Globulin, Total: 2.5 g/dL (ref 1.5–4.5)
Glucose: 97 mg/dL (ref 65–99)
Potassium: 4.4 mmol/L (ref 3.5–5.2)
Sodium: 141 mmol/L (ref 134–144)
Total Protein: 7.2 g/dL (ref 6.0–8.5)

## 2020-12-13 LAB — CARBAMAZEPINE LEVEL, TOTAL: Carbamazepine (Tegretol), S: 6.5 ug/mL (ref 4.0–12.0)

## 2020-12-14 ENCOUNTER — Telehealth: Payer: Self-pay | Admitting: *Deleted

## 2020-12-14 NOTE — Progress Notes (Signed)
Labs were fine with the exception of mildly impaired kidney function which is not an actual new finding.  Please encourage patient to continue to follow-up with his primary care physician or kidney specialist if he has 1 and hydrate well with water.  Carbamazepine level and other labs are fine.

## 2020-12-14 NOTE — Telephone Encounter (Signed)
I have spoken with the patient. He verbalized understanding of the findings. He was in agreement to increase his water intake and follow up with his PCP.

## 2020-12-14 NOTE — Telephone Encounter (Signed)
-----   Message from [name], MD sent at 12/14/2020 12:13 PM EST ----- Labs were fine with the exception of mildly impaired kidney function which is not an actual new finding.  Please encourage patient to continue to follow-up with his primary care physician or kidney specialist if he has 1 and hydrate well with water.  Carbamazepine level and other labs are fine.

## 2021-02-24 IMAGING — DX DG SHOULDER 2+V*R*
3 series · 3 of 3 positions shown · non-contrast
Comparison: None.

CLINICAL DATA: Right shoulder pain after lifting injury 1 month
ago.

EXAM:
RIGHT SHOULDER - 2+ VIEW

[dg shoulder right (1 of 3)]
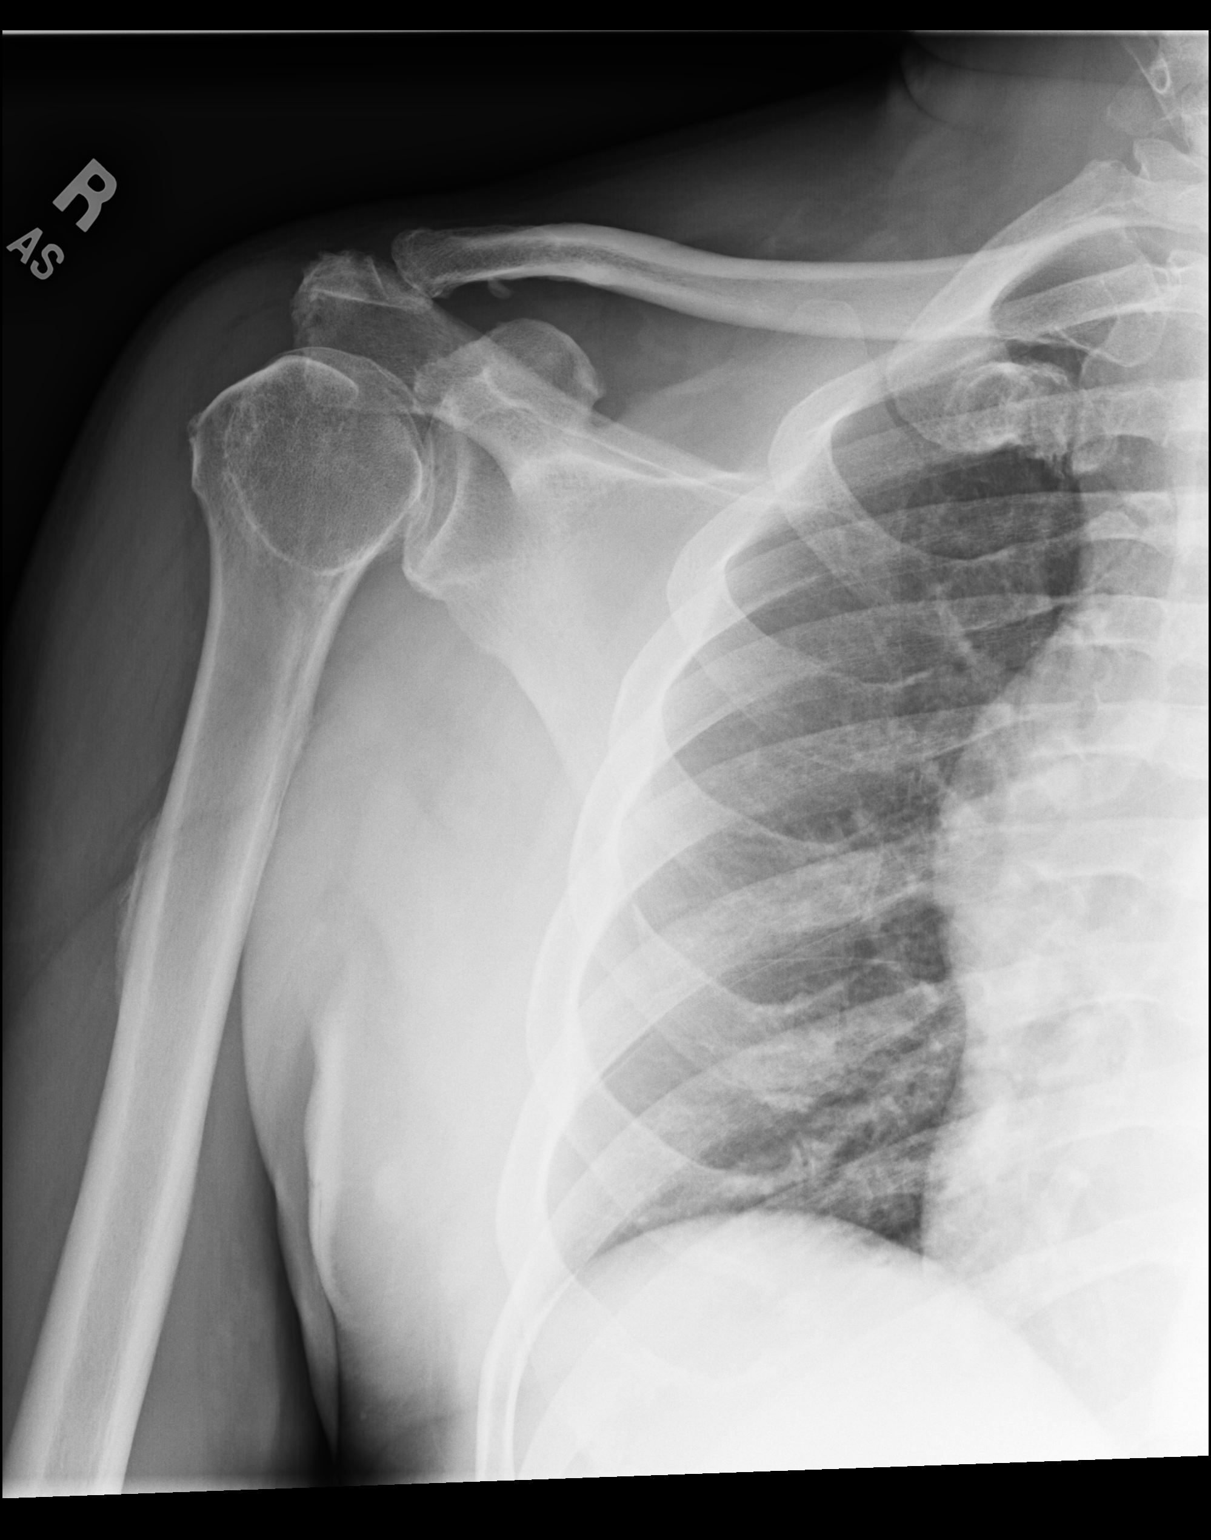

[dg shoulder right (2 of 3)]
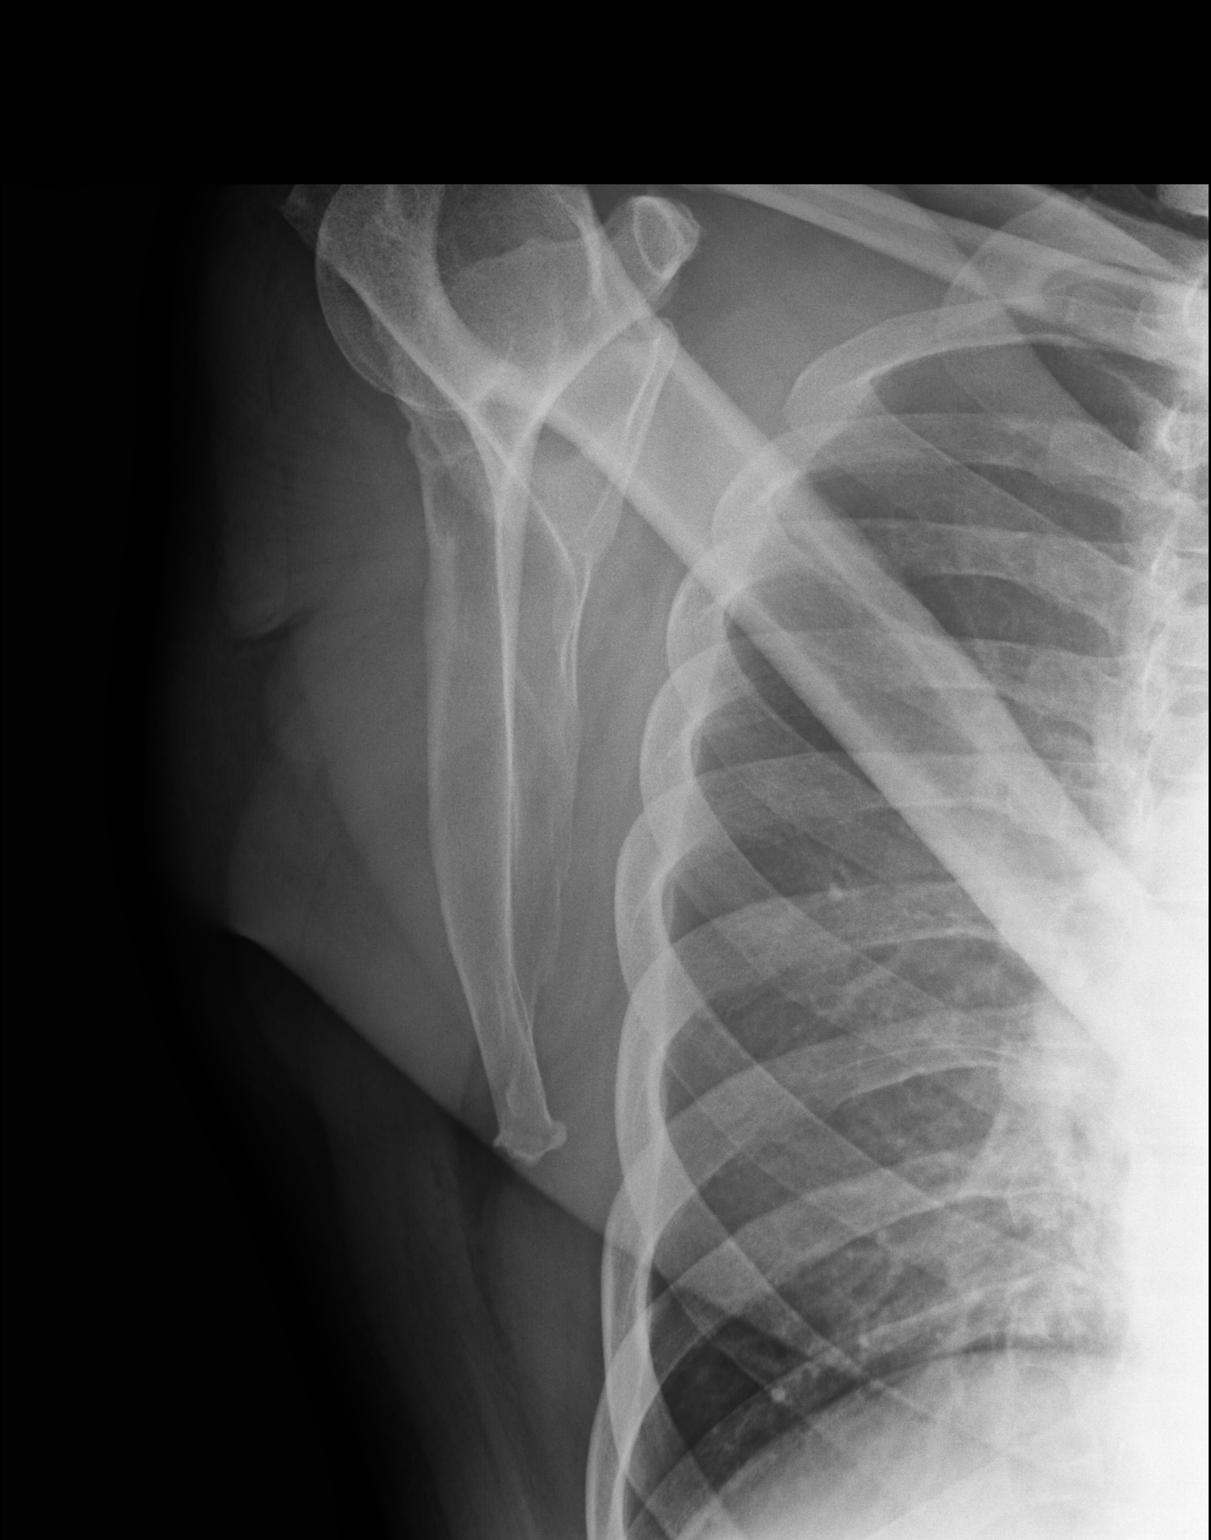

[dg shoulder right (3 of 3)]
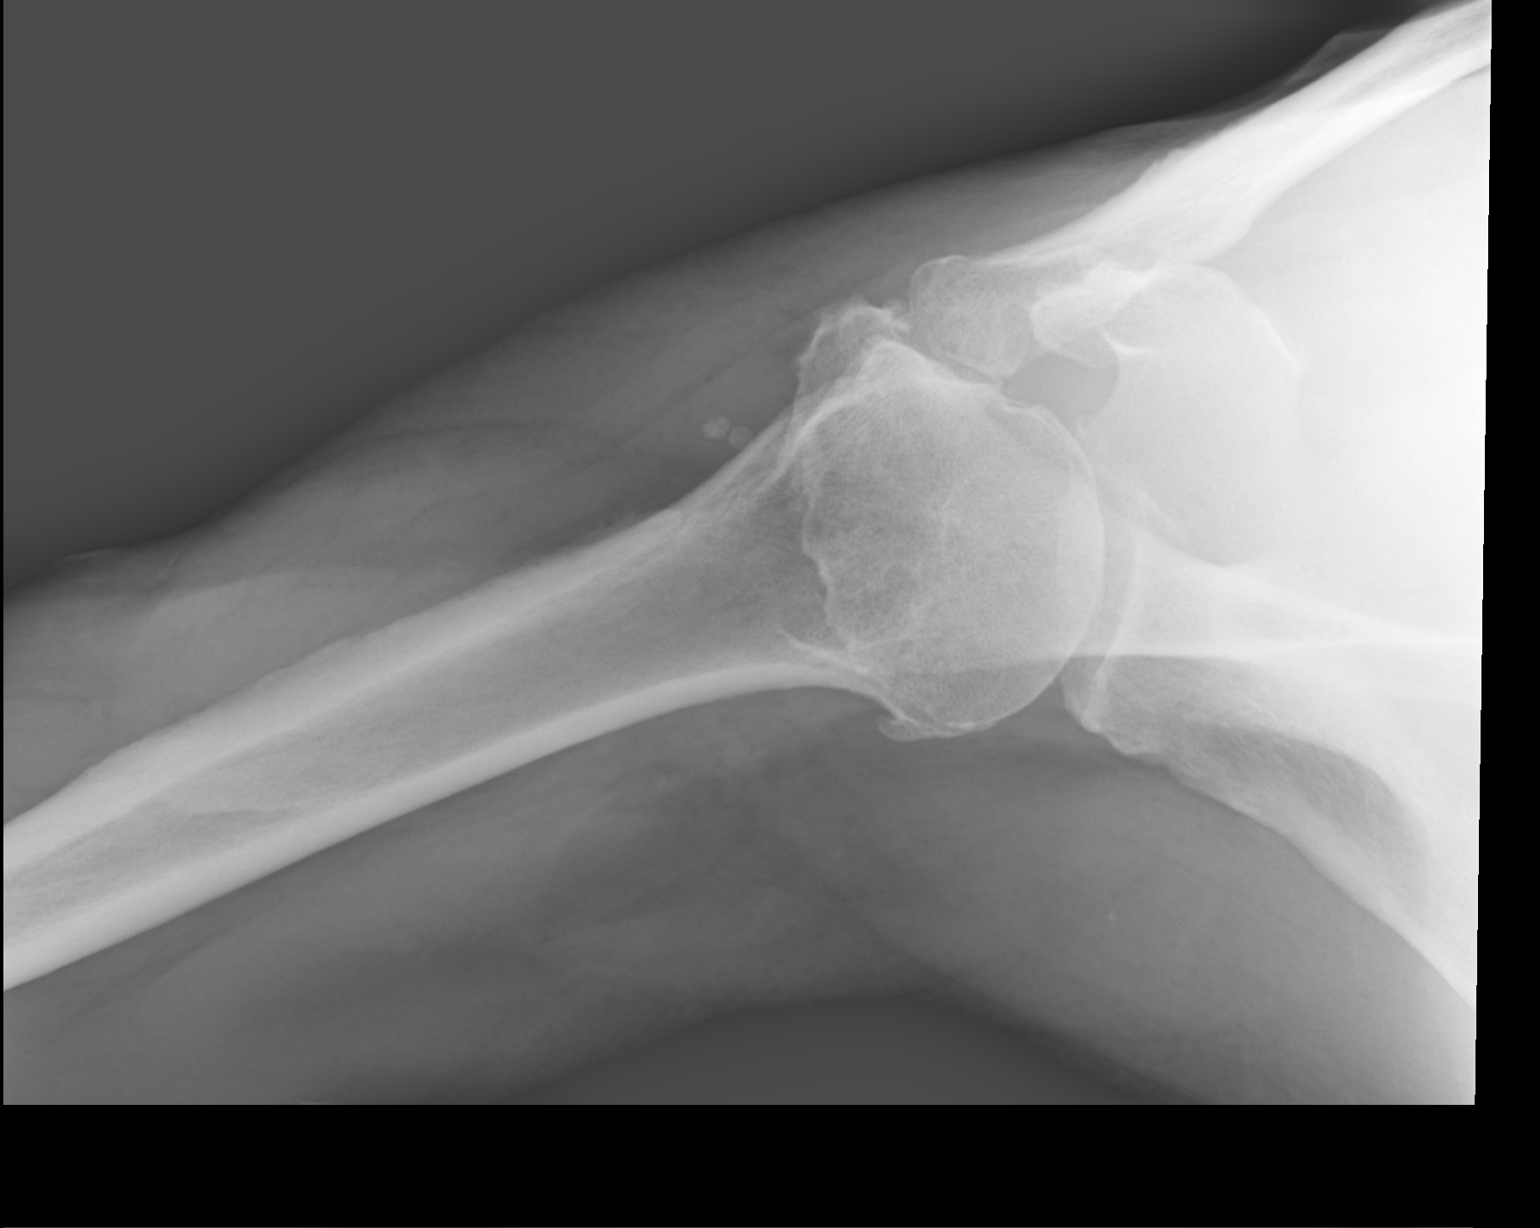

[3 of 3 positions shown; findings below may reference images not displayed]

FINDINGS: There is no evidence of fracture or dislocation. There is no
evidence of arthropathy or other focal bone abnormality. Soft
tissues are unremarkable.
IMPRESSION: Negative.

## 2021-03-27 ENCOUNTER — Other Ambulatory Visit: Payer: Self-pay | Admitting: Adult Health

## 2021-03-29 ENCOUNTER — Other Ambulatory Visit: Payer: Self-pay | Admitting: Adult Health

## 2021-05-15 ENCOUNTER — Other Ambulatory Visit: Payer: Self-pay | Admitting: Adult Health

## 2021-07-31 ENCOUNTER — Telehealth: Payer: Self-pay | Admitting: Adult Health

## 2021-07-31 DIAGNOSIS — G500 Trigeminal neuralgia: Secondary | ICD-10-CM

## 2021-07-31 MED ORDER — GABAPENTIN 400 MG PO CAPS: 400.0000 mg | ORAL_CAPSULE | Freq: Three times a day (TID) | ORAL | 3 refills | Status: DC

## 2021-07-31 NOTE — Telephone Encounter (Signed)
Refill sent to pharmacy.   

## 2021-07-31 NOTE — Telephone Encounter (Signed)
Pt has called for a refill on his gabapentin (NEURONTIN) 400 MG capsule to [facility] [phone]

## 2021-08-06 ENCOUNTER — Telehealth: Payer: Self-pay | Admitting: Adult Health

## 2021-08-06 NOTE — Telephone Encounter (Signed)
Patient came into the lobby with his gabapentin 400mg  and was asking when it changed from 200 to 400. He has been feeling dizzy for awhile and thinks it may be the medication. He would like an appt to come see or talk to [facility] about the increased dosage. I looked at the chart and it seems the dosage increase was done 07/2020.I didn't ,make an appt because wanted to wait until you spoke to someone.

## 2021-08-07 NOTE — Telephone Encounter (Signed)
I called pt back.  He is taking gabapentin 400mg  po DAILY and carbamazepine 200mg  po BID at this time and is doing well, no pain.  He stated he is taking for the last 3 months like this.  Last time in the office 10/2020 he stated that he was taking carbamazepine 200mg  po tid and gabapentin 400mg  po tid.  I relayed if he is doing ok at this time with the decreased dose, then I would continue and if flare then could increase to ordered dose as needed.  I would relay to MM/NP and any change would let him know.  Relating to dizziness that he states he has that comes and goes.  (I relayed that dizziness is symptom , could be caused from a number of things, blood sugar, blood pressure, dehydration.  Recommended seeing pcp for initial evaluation and if they think needs referral to neuro to evaluate to send another referral to address.  He and wife verbalized understanding.

## 2021-08-07 NOTE — Telephone Encounter (Signed)
noted 

## 2021-09-19 ENCOUNTER — Other Ambulatory Visit: Payer: Self-pay | Admitting: Neurology

## 2021-09-20 NOTE — Telephone Encounter (Signed)
Pt has appt 10/2021 (annual).  Takes BID-TID depending on sx flares.

## 2021-10-31 ENCOUNTER — Encounter: Payer: Self-pay | Admitting: Adult Health

## 2021-10-31 ENCOUNTER — Ambulatory Visit (INDEPENDENT_AMBULATORY_CARE_PROVIDER_SITE_OTHER): Payer: 59 | Admitting: Adult Health

## 2021-10-31 VITALS — BP 103/72 | HR 72 | Ht 70.0 in | Wt 213.1 lb

## 2021-10-31 DIAGNOSIS — Z5181 Encounter for therapeutic drug level monitoring: Secondary | ICD-10-CM | POA: Diagnosis not present

## 2021-10-31 DIAGNOSIS — G500 Trigeminal neuralgia: Secondary | ICD-10-CM | POA: Diagnosis not present

## 2021-10-31 NOTE — Progress Notes (Signed)
PATIENT: Gary Prince DOB: [birth year]  REASON FOR VISIT: follow up HISTORY FROM: patient  HISTORY OF PRESENT ILLNESS: Today 10/31/21:  Gary Prince is a 65 year old male with a history of trigeminal neuralgia on the left side.  He returns today for follow-up.  Overall he continues to do well.  Remains on gabapentin and Tegretol.  States on occasion he may have breakthrough pain but this is not often.  He returns today for an evaluation.  10/31/20: Gary Prince is a 65 year old male with a history of trigeminal neuralgia on the left.  He returns today for follow-up.  He is currently taking gabapentin 400 mg 3 times a day and Tegretol 200 mg 3 times a day.  He states that he has not noticed any pain in the left side of the face.  Overall he has been doing well.  He returns today for follow-up.  HISTORY 06/22/20:   Gary Prince is a 65 year old male with a history of trigeminal neuralgia on the left.  He returns today for follow-up.  We recently increased his gabapentin to 200 mg 3 times a day.  He reports that since the increase his facial pain has improved.  He continues on Tegretol.  He returns today for an evaluation.   HISTORY 05/09/20:   Gary Prince  is a 65 year old male with a history of trigeminal neuralgia on the left.  He returns today for follow-up.  He states that starting 3 weeks ago he began to have discomfort again in his face.  Reports that movement seems to make it worse.  Reports that hot and cold food seems to exacerbate the pain as well.  His prescription of carbamazepine is written to take 1 tablet in the morning, half a tablet midday and 1 tablet at bedtime.  He reports that he has been only taking 1 tablet in the morning and half a tablet at bedtime.  He remains on gabapentin at bedtime.  His MRI is scheduled for June 5.    REVIEW OF SYSTEMS: Out of a complete 14 system review of symptoms, the patient complains only of the following symptoms, and all other reviewed systems are  negative.  See HPI  ALLERGIES: No Known Allergies  HOME MEDICATIONS: Outpatient Medications Prior to Visit  Medication Sig Dispense Refill   carbamazepine (TEGRETOL) 200 MG tablet Take 1 tablet (200 mg total) by mouth 3 (three) times daily. 270 tablet 0   cloNIDine (CATAPRES) 0.2 MG tablet Take 0.2 mg by mouth daily.     gabapentin (NEURONTIN) 400 MG capsule Take 1 capsule (400 mg total) by mouth 3 (three) times daily. 90 capsule 3   terbinafine (LAMISIL) 250 MG tablet Take 250 mg by mouth daily.  1   traZODone (DESYREL) 50 MG tablet Take 50 mg by mouth daily.  2   valsartan-hydrochlorothiazide (DIOVAN-HCT) 320-25 MG tablet Take 1 tablet by mouth daily.  2   No facility-administered medications prior to visit.    PAST MEDICAL HISTORY: Past Medical History:  Diagnosis Date   Hypertension     PAST SURGICAL HISTORY: Past Surgical History:  Procedure Laterality Date   HERNIA REPAIR     TOOTH EXTRACTION      FAMILY HISTORY: Family History  Problem Relation Age of Onset   Stroke Mother    Stroke Father     SOCIAL HISTORY: Social History   Socioeconomic History   Marital status: Married    Spouse name: Not on file   Number of children:  Not on file   Years of education: Not on file   Highest education level: Not on file  Occupational History   Not on file  Tobacco Use   Smoking status: Never   Smokeless tobacco: Never  Vaping Use   Vaping Use: Never used  Substance and Sexual Activity   Alcohol use: Yes    Alcohol/week: 8.0 standard drinks    Types: 8 Standard drinks or equivalent per week   Drug use: Not on file   Sexual activity: Not on file  Other Topics Concern   Not on file  Social History Narrative   1 CUP OF COFFEE DAILY   RIGHT HANDED   Social Determinants of Health   Financial Resource Strain: Not on file  Food Insecurity: Not on file  Transportation Needs: Not on file  Physical Activity: Not on file  Stress: Not on file  Social Connections:  Not on file  Intimate Partner Violence: Not on file      PHYSICAL EXAM  Vitals:   10/31/21 1145  BP: 103/72  Pulse: 72  Weight: 213 lb 1.6 oz (96.7 kg)  Height: 5\' 10"  (1.778 m)    Body mass index is 30.58 kg/m.  Generalized: Well developed, in no acute distress   Neurological examination  Mentation: Alert oriented to time, place, history taking. Follows all commands speech and language fluent Cranial nerve II-XII: Pupils were equal round reactive to light. Extraocular movements were full, visual field were full on confrontational test. Facial sensation and strength were normal. Uvula tongue midline. Head turning and shoulder shrug  were normal and symmetric. Motor: The motor testing reveals 5 over 5 strength of all 4 extremities. Good symmetric motor tone is noted throughout.  Sensory: Sensory testing is intact to soft touch on all 4 extremities. No evidence of extinction is noted.  Coordination: Cerebellar testing reveals good finger-nose-finger and heel-to-shin bilaterally.  Gait and station: Gait is normal.  Reflexes: Deep tendon reflexes are symmetric and normal bilaterally.   DIAGNOSTIC DATA (LABS, IMAGING, TESTING) - I reviewed patient records, labs, notes, testing and imaging myself where available.  Lab Results  Component Value Date   WBC 8.5 12/12/2020   HGB 15.5 12/12/2020   HCT 47.6 12/12/2020   MCV 87 12/12/2020   PLT 202 12/12/2020      Component Value Date/Time   NA 141 12/12/2020 1047   K 4.4 12/12/2020 1047   CL 100 12/12/2020 1047   CO2 22 12/12/2020 1047   GLUCOSE 97 12/12/2020 1047   BUN 20 12/12/2020 1047   CREATININE 1.29 (H) 12/12/2020 1047   CALCIUM 9.8 12/12/2020 1047   PROT 7.2 12/12/2020 1047   ALBUMIN 4.7 12/12/2020 1047   AST 13 12/12/2020 1047   ALT 15 12/12/2020 1047   ALKPHOS 66 12/12/2020 1047   BILITOT <0.2 12/12/2020 1047   GFRNONAA 57 (L) 12/12/2020 1047   GFRAA 66 12/12/2020 1047    Lab Results  Component Value Date    HGBA1C 5.8 (H) 11/09/2018      ASSESSMENT AND PLAN 65 y.o. year old male  has a past medical history of Hypertension. here with:  1.  Trigeminal neuralgia  -Continue gabapentin 400 mg 3 times a day -Continue Tegretol 200 mg 3 times a day -Blood work today -Advised if his symptoms worsen or he develops new symptoms he should let 79 know -Follow-up in 1 year or sooner if needed   [place], MSN, NP-C 10/31/2021, 11:45 AM [facility] [phone]  [address], [place], [place] [address] [phone]

## 2021-10-31 NOTE — Patient Instructions (Signed)
Your Plan:  Continue Carbamazepine and Gabapentin Blood work today If your symptoms worsen or you develop new symptoms please let us know.    Thank you for coming to see [place] at [facility]. I hope we have been able to provide you high quality care today.  You may receive a patient satisfaction survey over the next few weeks. We would appreciate your feedback and comments so that we may continue to improve ourselves and the health of our patients.

## 2021-11-01 ENCOUNTER — Telehealth: Payer: Self-pay

## 2021-11-01 LAB — CBC WITH DIFFERENTIAL/PLATELET
Basophils Absolute: 0.1 10*3/uL (ref 0.0–0.2)
Basos: 1 %
EOS (ABSOLUTE): 0.2 10*3/uL (ref 0.0–0.4)
Eos: 2 %
Hematocrit: 48 % (ref 37.5–51.0)
Hemoglobin: 15.7 g/dL (ref 13.0–17.7)
Immature Grans (Abs): 0.1 10*3/uL (ref 0.0–0.1)
Immature Granulocytes: 1 %
Lymphocytes Absolute: 2.8 10*3/uL (ref 0.7–3.1)
Lymphs: 32 %
MCH: 27.7 pg (ref 26.6–33.0)
MCHC: 32.7 g/dL (ref 31.5–35.7)
MCV: 85 fL (ref 79–97)
Monocytes Absolute: 0.8 10*3/uL (ref 0.1–0.9)
Monocytes: 9 %
Neutrophils Absolute: 5 10*3/uL (ref 1.4–7.0)
Neutrophils: 55 %
Platelets: 197 10*3/uL (ref 150–450)
RBC: 5.67 x10E6/uL (ref 4.14–5.80)
RDW: 14.8 % (ref 11.6–15.4)
WBC: 8.8 10*3/uL (ref 3.4–10.8)

## 2021-11-01 LAB — COMPREHENSIVE METABOLIC PANEL
ALT: 14 IU/L (ref 0–44)
AST: 11 IU/L (ref 0–40)
Albumin/Globulin Ratio: 2 (ref 1.2–2.2)
Albumin: 4.7 g/dL (ref 3.8–4.8)
Alkaline Phosphatase: 72 IU/L (ref 44–121)
BUN/Creatinine Ratio: 15 (ref 10–24)
BUN: 18 mg/dL (ref 8–27)
Bilirubin Total: <0.2 mg/dL (ref 0.0–1.2)
CO2: 24 mmol/L (ref 20–29)
Calcium: 9.6 mg/dL (ref 8.6–10.2)
Chloride: 99 mmol/L (ref 96–106)
Creatinine, Ser: 1.18 mg/dL (ref 0.76–1.27)
Globulin, Total: 2.3 g/dL (ref 1.5–4.5)
Glucose: 75 mg/dL (ref 70–99)
Potassium: 4.5 mmol/L (ref 3.5–5.2)
Sodium: 138 mmol/L (ref 134–144)
Total Protein: 7 g/dL (ref 6.0–8.5)
eGFR: 68 mL/min/{1.73_m2} (ref >59)

## 2021-11-01 LAB — CARBAMAZEPINE LEVEL, TOTAL: Carbamazepine (Tegretol), S: 6.5 ug/mL (ref 4.0–12.0)

## 2021-11-01 NOTE — Telephone Encounter (Signed)
-----   Message from Butch Penny, NP sent at 11/01/2021 11:38 AM EST ----- Blood work is unremarkable.

## 2021-11-01 NOTE — Telephone Encounter (Signed)
I called patient. I advised him of the unremarkable blood work results. Pt verbalized understanding.

## 2021-12-12 ENCOUNTER — Other Ambulatory Visit: Payer: Self-pay | Admitting: Adult Health

## 2022-01-21 ENCOUNTER — Other Ambulatory Visit: Payer: Self-pay | Admitting: Adult Health

## 2022-01-21 DIAGNOSIS — G500 Trigeminal neuralgia: Secondary | ICD-10-CM

## 2022-01-24 ENCOUNTER — Telehealth: Payer: Self-pay | Admitting: Adult Health

## 2022-01-24 NOTE — Telephone Encounter (Signed)
I called pt back and advised of agreement of plan by NP. Pt will call us in about a week and let us know how he is doing.

## 2022-01-24 NOTE — Telephone Encounter (Signed)
I called the pt back. He reports over the last few weeks, intermittent dizziness has been an issue for him. Denis any recent falls.  He is currently taking Gabapentin 400 mg tid and Tegretol 200 mg tid.  Pt feels like sx are coming from dosage of Gabapentin. I suggested we could try backing the gabapentin dose down to 1 tablet bid to see if sx improve and he was amenable to a trial of this.   Pt was advised I would ask NP on her recommendation as well and we could go from there.

## 2022-01-24 NOTE — Telephone Encounter (Signed)
Agree with plan 

## 2022-01-24 NOTE — Telephone Encounter (Signed)
Pt LVM on gabapentin (NEURONTIN) 400 MG capsule  think giving me extreme dizziness, afraid to walk because I'll fall down. Would like a call from the nurse.

## 2022-04-03 ENCOUNTER — Telehealth: Payer: Self-pay | Admitting: Adult Health

## 2022-04-03 NOTE — Telephone Encounter (Signed)
Spoke with the patient.  He states last Sunday the pain came back on the left side of his face and he increased his gabapentin back to 400 mg 3 times daily.  Previously in February his Gabapentin was decreased to 400 mg twice a day due to some dizziness.  The pain has been persistent and it is his typical pain.  He denies any other new issues or new medications, and he confirmed he still takes Tegretol 200 mg 3 times a day.  I let him know I would discuss with the nurse practitioner and give him a call back with instructions. He verbalized appreciation.  ? ?

## 2022-04-03 NOTE — Telephone Encounter (Signed)
Not sure that he can try higher doses of gabapentin due to dizziness.  May need to get the patient in for a revisit to discuss adding on a new medication.  ?

## 2022-04-03 NOTE — Telephone Encounter (Signed)
Pt called wanted to know if there is anything he can be doing regarding his pain. ?Shocking pains on left side of his face, pain is lasting all day and wont go away, would like a call back.  ?[phone] ?

## 2022-04-04 NOTE — Telephone Encounter (Signed)
Noted. Will offer pt a cancellation for next week.  ?

## 2022-04-04 NOTE — Telephone Encounter (Signed)
Spoke with patient. Scheduled him for sooner appt  next Wed 4/26 at 3:00 PM with [facility] NP. Canceled 07/11/22 appt since seen sooner.  ?

## 2022-04-10 ENCOUNTER — Ambulatory Visit (INDEPENDENT_AMBULATORY_CARE_PROVIDER_SITE_OTHER): Payer: Medicare Other | Admitting: Adult Health

## 2022-04-10 ENCOUNTER — Encounter: Payer: Self-pay | Admitting: Adult Health

## 2022-04-10 VITALS — BP 164/93 | HR 58 | Ht 70.0 in | Wt 219.0 lb

## 2022-04-10 DIAGNOSIS — Z5181 Encounter for therapeutic drug level monitoring: Secondary | ICD-10-CM

## 2022-04-10 DIAGNOSIS — G500 Trigeminal neuralgia: Secondary | ICD-10-CM

## 2022-04-10 NOTE — Patient Instructions (Signed)
Your Plan: ? ?Continue Carbamazepine and Gabapentin ?Blood work today ?Consider adding baclofen in the future if needed ? ?Thank you for coming to see [place] at [facility]. I hope we have been able to provide you high quality care today. ? ?You may receive a patient satisfaction survey over the next few weeks. We would appreciate your feedback and comments so that we may continue to improve ourselves and the health of our patients. ? ?

## 2022-04-10 NOTE — Progress Notes (Signed)
? ? ?PATIENT: Gary Prince ?DOB: 18-Sep-1956 ? ?REASON FOR VISIT: follow up ?HISTORY FROM: patient ? ?HISTORY OF PRESENT ILLNESS: ?Today 04/10/22: ?Gary Prince is a 66 year old male with a history of trigeminal neuralgia.  He returns today for follow-up.  He reports that last week he began having breakthrough pain however for the last three days no pain at all. Continues on gabapentin 300 mg TID and Carbamazepine 200 mg TID. Has trazodone at night but only takes it when he needs it.  Blood pressure is elevated today reports that he took himself off of his blood pressure medicine. ? ?11/16/22[name] is a 66 year old male with a history of trigeminal neuralgia on the left side.  He returns today for follow-up.  Overall he continues to do well.  Remains on gabapentin and Tegretol.  States on occasion he may have breakthrough pain but this is not often.  He returns today for an evaluation. ? ?10/31/20: Gary Prince is a 66 year old male with a history of trigeminal neuralgia on the left.  He returns today for follow-up.  He is currently taking gabapentin 400 mg 3 times a day and Tegretol 200 mg 3 times a day.  He states that he has not noticed any pain in the left side of the face.  Overall he has been doing well.  He returns today for follow-up. ? ?HISTORY 06/22/20: ?  ?Gary Prince is a 66 year old male with a history of trigeminal neuralgia on the left.  He returns today for follow-up.  We recently increased his gabapentin to 200 mg 3 times a day.  He reports that since the increase his facial pain has improved.  He continues on Tegretol.  He returns today for an evaluation. ?  ?HISTORY 05/09/20: ?  ?Gary Prince  is a 66 year old male with a history of trigeminal neuralgia on the left.  He returns today for follow-up.  He states that starting 3 weeks ago he began to have discomfort again in his face.  Reports that movement seems to make it worse.  Reports that hot and cold food seems to exacerbate the pain as well.  His  prescription of carbamazepine is written to take 1 tablet in the morning, half a tablet midday and 1 tablet at bedtime.  He reports that he has been only taking 1 tablet in the morning and half a tablet at bedtime.  He remains on gabapentin at bedtime.  His MRI is scheduled for June 5. ?  ? ?REVIEW OF SYSTEMS: Out of a complete 14 system review of symptoms, the patient complains only of the following symptoms, and all other reviewed systems are negative. ? ?See HPI ? ?ALLERGIES: ?No Known Allergies ? ?HOME MEDICATIONS: ?Outpatient Medications Prior to Visit  ?Medication Sig Dispense Refill  ? carbamazepine (TEGRETOL) 200 MG tablet TAKE 1 TABLET BY MOUTH 3 TIMES DAILY. 270 tablet 3  ? cloNIDine (CATAPRES) 0.2 MG tablet Take 0.2 mg by mouth daily.    ? gabapentin (NEURONTIN) 400 MG capsule TAKE 1 CAPSULE BY MOUTH 3 TIMES DAILY. 90 capsule 3  ? terbinafine (LAMISIL) 250 MG tablet Take 250 mg by mouth daily.  1  ? traZODone (DESYREL) 50 MG tablet Take 50 mg by mouth daily.  2  ? valsartan-hydrochlorothiazide (DIOVAN-HCT) 320-25 MG tablet Take 1 tablet by mouth daily.  2  ? ?No facility-administered medications prior to visit.  ? ? ?PAST MEDICAL HISTORY: ?Past Medical History:  ?Diagnosis Date  ? Hypertension   ? ? ?PAST SURGICAL HISTORY: ?  Past Surgical History:  ?Procedure Laterality Date  ? HERNIA REPAIR    ? TOOTH EXTRACTION    ? ? ?FAMILY HISTORY: ?Family History  ?Problem Relation Age of Onset  ? Stroke Mother   ? Stroke Father   ? Kidney failure Brother   ? Kidney failure Brother   ? ? ?SOCIAL HISTORY: ?Social History  ? ?Socioeconomic History  ? Marital status: Married  ?  Spouse name: Not on file  ? Number of children: Not on file  ? Years of education: Not on file  ? Highest education level: Not on file  ?Occupational History  ? Not on file  ?Tobacco Use  ? Smoking status: Never  ? Smokeless tobacco: Never  ?Vaping Use  ? Vaping Use: Never used  ?Substance and Sexual Activity  ? Alcohol use: Not Currently  ?   Alcohol/week: 8.0 standard drinks  ?  Types: 8 Standard drinks or equivalent per week  ?  Comment: "I stopped drinking" around January 2023  ? Drug use: Never  ? Sexual activity: Not on file  ?Other Topics Concern  ? Not on file  ?Social History Narrative  ? 1 CUP OF COFFEE DAILY  ? RIGHT HANDED  ? ?Social Determinants of Health  ? ?Financial Resource Strain: Not on file  ?Food Insecurity: Not on file  ?Transportation Needs: Not on file  ?Physical Activity: Not on file  ?Stress: Not on file  ?Social Connections: Not on file  ?Intimate Partner Violence: Not on file  ? ? ? ? ?PHYSICAL EXAM ? ?Vitals:  ? 04/10/22 1516  ?Weight: 219 lb (99.3 kg)  ?Height: 5\' 10"  (1.778 m)  ? ? ?Body mass index is 31.42 kg/m?. ? ?Generalized: Well developed, in no acute distress  ? ?Neurological examination  ?Mentation: Alert oriented to time, place, history taking. Follows all commands speech and language fluent ?Cranial nerve II-XII: Pupils were equal round reactive to light. Extraocular movements were full, visual field were full on confrontational test. Facial sensation and strength were normal. . Head turning and shoulder shrug  were normal and symmetric. ?Motor: The motor testing reveals 5 over 5 strength of all 4 extremities. Good symmetric motor tone is noted throughout.  ?Sensory: Sensory testing is intact to soft touch on all 4 extremities. No evidence of extinction is noted.  ?Coordination: Cerebellar testing reveals good finger-nose-finger and heel-to-shin bilaterally.  ?Gait and station: Gait is normal.  ?Reflexes: Deep tendon reflexes are symmetric and normal bilaterally.  ? ?DIAGNOSTIC DATA (LABS, IMAGING, TESTING) ?- I reviewed patient records, labs, notes, testing and imaging myself where available. ? ?Lab Results  ?Component Value Date  ? WBC 8.8 10/31/2021  ? HGB 15.7 10/31/2021  ? HCT 48.0 10/31/2021  ? MCV 85 10/31/2021  ? PLT 197 10/31/2021  ? ?   ?Component Value Date/Time  ? NA 138 10/31/2021 1200  ? K 4.5  10/31/2021 1200  ? CL 99 10/31/2021 1200  ? CO2 24 10/31/2021 1200  ? GLUCOSE 75 10/31/2021 1200  ? BUN 18 10/31/2021 1200  ? CREATININE 1.18 10/31/2021 1200  ? CALCIUM 9.6 10/31/2021 1200  ? PROT 7.0 10/31/2021 1200  ? ALBUMIN 4.7 10/31/2021 1200  ? AST 11 10/31/2021 1200  ? ALT 14 10/31/2021 1200  ? ALKPHOS 72 10/31/2021 1200  ? BILITOT <0.2 10/31/2021 1200  ? GFRNONAA 57 (L) 12/12/2020 1047  ? GFRAA 66 12/12/2020 1047  ? ? ?Lab Results  ?Component Value Date  ? HGBA1C 5.8 (H) 11/09/2018  ? ? ? ? ?  ASSESSMENT AND PLAN ?66 y.o. year old male  has a past medical history of Hypertension. here with: ? ?1.  Trigeminal neuralgia ? ?-Continue gabapentin 400 mg 3 times a day ?-Continue Tegretol 200 mg 3 times a day ?-Blood work today ?-If pain returns may consider adding on baclofen 5 to 10 mg at bedtime ?-Advised if his symptoms worsen or he develops new symptoms he should let us know ?-Follow-up in 6 months or sooner if needed ? ? ? ?Ward Givens, MSN, NP-C 04/10/2022, 3:17 PM ?[facility] ?[address], [address] ?[place], [place] [address] ?[phone] ? ? ?

## 2022-04-11 ENCOUNTER — Telehealth: Payer: Self-pay | Admitting: *Deleted

## 2022-04-11 LAB — COMPREHENSIVE METABOLIC PANEL
ALT: 11 IU/L (ref 0–44)
AST: 10 IU/L (ref 0–40)
Albumin/Globulin Ratio: 1.9 (ref 1.2–2.2)
Albumin: 4.4 g/dL (ref 3.8–4.8)
Alkaline Phosphatase: 70 IU/L (ref 44–121)
BUN/Creatinine Ratio: 13 (ref 10–24)
BUN: 12 mg/dL (ref 8–27)
Bilirubin Total: <0.2 mg/dL (ref 0.0–1.2)
CO2: 23 mmol/L (ref 20–29)
Calcium: 9.6 mg/dL (ref 8.6–10.2)
Chloride: 100 mmol/L (ref 96–106)
Creatinine, Ser: 0.95 mg/dL (ref 0.76–1.27)
Globulin, Total: 2.3 g/dL (ref 1.5–4.5)
Glucose: 95 mg/dL (ref 70–99)
Potassium: 4.2 mmol/L (ref 3.5–5.2)
Sodium: 140 mmol/L (ref 134–144)
Total Protein: 6.7 g/dL (ref 6.0–8.5)
eGFR: 87 mL/min/{1.73_m2} (ref >59)

## 2022-04-11 LAB — CBC WITH DIFFERENTIAL/PLATELET
Basophils Absolute: 0 10*3/uL (ref 0.0–0.2)
Basos: 0 %
EOS (ABSOLUTE): 0.2 10*3/uL (ref 0.0–0.4)
Eos: 2 %
Hematocrit: 46.5 % (ref 37.5–51.0)
Hemoglobin: 15 g/dL (ref 13.0–17.7)
Immature Grans (Abs): 0.1 10*3/uL (ref 0.0–0.1)
Immature Granulocytes: 1 %
Lymphocytes Absolute: 2.6 10*3/uL (ref 0.7–3.1)
Lymphs: 29 %
MCH: 27.1 pg (ref 26.6–33.0)
MCHC: 32.3 g/dL (ref 31.5–35.7)
MCV: 84 fL (ref 79–97)
Monocytes Absolute: 0.7 10*3/uL (ref 0.1–0.9)
Monocytes: 7 %
Neutrophils Absolute: 5.5 10*3/uL (ref 1.4–7.0)
Neutrophils: 61 %
Platelets: 205 10*3/uL (ref 150–450)
RBC: 5.53 x10E6/uL (ref 4.14–5.80)
RDW: 14.1 % (ref 11.6–15.4)
WBC: 8.9 10*3/uL (ref 3.4–10.8)

## 2022-04-11 LAB — CARBAMAZEPINE LEVEL, TOTAL: Carbamazepine (Tegretol), S: 6 ug/mL (ref 4.0–12.0)

## 2022-04-11 NOTE — Telephone Encounter (Signed)
Called pt & advised yesterday's labs are unremarkable per MM NP, no concerns. Pt verbalized understanding and appreciation.  ?

## 2022-04-11 NOTE — Telephone Encounter (Signed)
-----   Message from Butch Penny, NP sent at 04/11/2022 10:19 AM EDT ----- ?Labs results are unremarkable. Please call patient with results.  ? ? ?

## 2022-06-17 ENCOUNTER — Other Ambulatory Visit: Payer: Self-pay | Admitting: Adult Health

## 2022-06-17 DIAGNOSIS — G500 Trigeminal neuralgia: Secondary | ICD-10-CM

## 2022-07-11 ENCOUNTER — Ambulatory Visit: Payer: Medicare Other | Admitting: Adult Health

## 2022-10-16 NOTE — Progress Notes (Unsigned)
PATIENT: Gary Prince DOB: Mar 14, 1956  REASON FOR VISIT: follow up HISTORY FROM: patient  HISTORY OF PRESENT ILLNESS: Today 10/16/22: Gary Prince is a 66 year old male with a history of trigeminal neuralgia.  He returns today for follow-up  04/10/22: Gary Prince is a 66 year old male with a history of trigeminal neuralgia.  He returns today for follow-up.  He reports that last week he began having breakthrough pain however for the last three days no pain at all. Continues on gabapentin 300 mg TID and Carbamazepine 200 mg TID. Has trazodone at night but only takes it when he needs it.  Blood pressure is elevated today reports that he took himself off of his blood pressure medicine.  11/16/22[name] is a 66 year old male with a history of trigeminal neuralgia on the left side.  He returns today for follow-up.  Overall he continues to do well.  Remains on gabapentin and Tegretol.  States on occasion he may have breakthrough pain but this is not often.  He returns today for an evaluation.  10/31/20: Gary Prince is a 66 year old male with a history of trigeminal neuralgia on the left.  He returns today for follow-up.  He is currently taking gabapentin 400 mg 3 times a day and Tegretol 200 mg 3 times a day.  He states that he has not noticed any pain in the left side of the face.  Overall he has been doing well.  He returns today for follow-up.  HISTORY 06/22/20:   Gary Prince is a 66 year old male with a history of trigeminal neuralgia on the left.  He returns today for follow-up.  We recently increased his gabapentin to 200 mg 3 times a day.  He reports that since the increase his facial pain has improved.  He continues on Tegretol.  He returns today for an evaluation.   HISTORY 05/09/20:   Gary Prince  is a 66 year old male with a history of trigeminal neuralgia on the left.  He returns today for follow-up.  He states that starting 3 weeks ago he began to have discomfort again in his face.  Reports  that movement seems to make it worse.  Reports that hot and cold food seems to exacerbate the pain as well.  His prescription of carbamazepine is written to take 1 tablet in the morning, half a tablet midday and 1 tablet at bedtime.  He reports that he has been only taking 1 tablet in the morning and half a tablet at bedtime.  He remains on gabapentin at bedtime.  His MRI is scheduled for June 5.    REVIEW OF SYSTEMS: Out of a complete 14 system review of symptoms, the patient complains only of the following symptoms, and all other reviewed systems are negative.  See HPI  ALLERGIES: No Known Allergies  HOME MEDICATIONS: Outpatient Medications Prior to Visit  Medication Sig Dispense Refill   carbamazepine (TEGRETOL) 200 MG tablet TAKE 1 TABLET BY MOUTH 3 TIMES DAILY. 270 tablet 3   cloNIDine (CATAPRES) 0.2 MG tablet Take 0.2 mg by mouth daily.     gabapentin (NEURONTIN) 400 MG capsule TAKE 1 CAPSULE BY MOUTH THREE TIMES A DAY 90 capsule 3   terbinafine (LAMISIL) 250 MG tablet Take 250 mg by mouth daily.  1   traZODone (DESYREL) 50 MG tablet Take 50 mg by mouth daily.  2   valsartan-hydrochlorothiazide (DIOVAN-HCT) 320-25 MG tablet Take 1 tablet by mouth daily.  2   No facility-administered medications prior to visit.  PAST MEDICAL HISTORY: Past Medical History:  Diagnosis Date   Hypertension     PAST SURGICAL HISTORY: Past Surgical History:  Procedure Laterality Date   HERNIA REPAIR     TOOTH EXTRACTION      FAMILY HISTORY: Family History  Problem Relation Age of Onset   Stroke Mother    Stroke Father    Kidney failure Brother    Kidney failure Brother     SOCIAL HISTORY: Social History   Socioeconomic History   Marital status: Married    Spouse name: Not on file   Number of children: Not on file   Years of education: Not on file   Highest education level: Not on file  Occupational History   Not on file  Tobacco Use   Smoking status: Never   Smokeless  tobacco: Never  Vaping Use   Vaping Use: Never used  Substance and Sexual Activity   Alcohol use: Not Currently    Alcohol/week: 8.0 standard drinks of alcohol    Types: 8 Standard drinks or equivalent per week    Comment: "I stopped drinking" around January 2023   Drug use: Never   Sexual activity: Not on file  Other Topics Concern   Not on file  Social History Narrative   1 CUP OF COFFEE DAILY   RIGHT HANDED   Social Determinants of Health   Financial Resource Strain: Not on file  Food Insecurity: Not on file  Transportation Needs: Not on file  Physical Activity: Not on file  Stress: Not on file  Social Connections: Not on file  Intimate Partner Violence: Not on file      PHYSICAL EXAM  There were no vitals filed for this visit.   There is no height or weight on file to calculate BMI.  Generalized: Well developed, in no acute distress   Neurological examination  Mentation: Alert oriented to time, place, history taking. Follows all commands speech and language fluent Cranial nerve II-XII: Pupils were equal round reactive to light. Extraocular movements were full, visual field were full on confrontational test. Facial sensation and strength were normal. . Head turning and shoulder shrug  were normal and symmetric. Motor: The motor testing reveals 5 over 5 strength of all 4 extremities. Good symmetric motor tone is noted throughout.  Sensory: Sensory testing is intact to soft touch on all 4 extremities. No evidence of extinction is noted.  Coordination: Cerebellar testing reveals good finger-nose-finger and heel-to-shin bilaterally.  Gait and station: Gait is normal.  Reflexes: Deep tendon reflexes are symmetric and normal bilaterally.   DIAGNOSTIC DATA (LABS, IMAGING, TESTING) - I reviewed patient records, labs, notes, testing and imaging myself where available.  Lab Results  Component Value Date   WBC 8.9 04/10/2022   HGB 15.0 04/10/2022   HCT 46.5 04/10/2022    MCV 84 04/10/2022   PLT 205 04/10/2022      Component Value Date/Time   NA 140 04/10/2022 1537   K 4.2 04/10/2022 1537   CL 100 04/10/2022 1537   CO2 23 04/10/2022 1537   GLUCOSE 95 04/10/2022 1537   BUN 12 04/10/2022 1537   CREATININE 0.95 04/10/2022 1537   CALCIUM 9.6 04/10/2022 1537   PROT 6.7 04/10/2022 1537   ALBUMIN 4.4 04/10/2022 1537   AST 10 04/10/2022 1537   ALT 11 04/10/2022 1537   ALKPHOS 70 04/10/2022 1537   BILITOT <0.2 04/10/2022 1537   GFRNONAA 57 (L) 12/12/2020 1047   GFRAA 66 12/12/2020 1047  Lab Results  Component Value Date   HGBA1C 5.8 (H) 11/09/2018      ASSESSMENT AND PLAN 66 y.o. year old male  has a past medical history of Hypertension. here with:  1.  Trigeminal neuralgia  -Continue gabapentin 400 mg 3 times a day -Continue Tegretol 200 mg 3 times a day -Blood work today -If pain returns may consider adding on baclofen 5 to 10 mg at bedtime -Advised if his symptoms worsen or he develops new symptoms he should let us know -Follow-up in 6 months or sooner if needed    Butch Penny, MSN, NP-C 10/16/2022, 5:06 PM [facility] [address] [place], [place] [address] [phone]

## 2022-10-17 ENCOUNTER — Ambulatory Visit (INDEPENDENT_AMBULATORY_CARE_PROVIDER_SITE_OTHER): Payer: Medicare Other | Admitting: Adult Health

## 2022-10-17 ENCOUNTER — Encounter: Payer: Self-pay | Admitting: Adult Health

## 2022-10-17 VITALS — BP 109/70 | HR 80 | Ht 70.0 in | Wt 222.0 lb

## 2022-10-17 DIAGNOSIS — G500 Trigeminal neuralgia: Secondary | ICD-10-CM | POA: Diagnosis not present

## 2022-10-17 MED ORDER — CARBAMAZEPINE 200 MG PO TABS: 200.0000 mg | ORAL_TABLET | Freq: Three times a day (TID) | ORAL | 3 refills | Status: DC

## 2022-10-17 NOTE — Patient Instructions (Signed)
Your Plan:  Continue gabapentin and carbamazepine If your symptoms worsen or you develop new symptoms please let us know.   Thank you for coming to see [place] at [facility]. I hope we have been able to provide you high quality care today.  You may receive a patient satisfaction survey over the next few weeks. We would appreciate your feedback and comments so that we may continue to improve ourselves and the health of our patients.

## 2022-10-31 ENCOUNTER — Other Ambulatory Visit: Payer: Self-pay | Admitting: Adult Health

## 2022-10-31 DIAGNOSIS — G500 Trigeminal neuralgia: Secondary | ICD-10-CM

## 2022-11-04 ENCOUNTER — Ambulatory Visit: Payer: Medicare Other | Admitting: Adult Health

## 2023-02-04 NOTE — Progress Notes (Unsigned)
PATIENT: Gary Prince DOB: Dec 31, 1955  REASON FOR VISIT: follow up HISTORY FROM: patient  No chief complaint on file.    HISTORY OF PRESENT ILLNESS: Today 02/04/23:  NITHIN MERKLIN is a 67 y.o. male with a history of Trigeminal Neuralgia. Returns today for follow-up.      10/17/22: Mr. Windholz is a 67 year old male with a history of trigeminal neuralgia.  He returns today for follow-up.  Continues on gabapentin 300 mg 3 times a day and carbamazepine 200 mg 3 times a day.  He reports that as of now his pain is under good control.  Denies any new symptoms.  Tolerates medication well.  Will occasionally take trazodone at night if needed.  04/10/22: Mr. Kadri is a 67 year old male with a history of trigeminal neuralgia.  He returns today for follow-up.  He reports that last week he began having breakthrough pain however for the last three days no pain at all. Continues on gabapentin 300 mg TID and Carbamazepine 200 mg TID. Has trazodone at night but only takes it when he needs it.  Blood pressure is elevated today reports that he took himself off of his blood pressure medicine.  11/16/22Mr. Tornatore is a 67 year old male with a history of trigeminal neuralgia on the left side.  He returns today for follow-up.  Overall he continues to do well.  Remains on gabapentin and Tegretol.  States on occasion he may have breakthrough pain but this is not often.  He returns today for an evaluation.  10/31/20: Mr. Mandala is a 67 year old male with a history of trigeminal neuralgia on the left.  He returns today for follow-up.  He is currently taking gabapentin 400 mg 3 times a day and Tegretol 200 mg 3 times a day.  He states that he has not noticed any pain in the left side of the face.  Overall he has been doing well.  He returns today for follow-up.  HISTORY 06/22/20:   Mr. Bittenbender is a 67 year old male with a history of trigeminal neuralgia on the left.  He returns today for follow-up.  We recently  increased his gabapentin to 200 mg 3 times a day.  He reports that since the increase his facial pain has improved.  He continues on Tegretol.  He returns today for an evaluation.   HISTORY 05/09/20:   Mr. Exton  is a 67 year old male with a history of trigeminal neuralgia on the left.  He returns today for follow-up.  He states that starting 3 weeks ago he began to have discomfort again in his face.  Reports that movement seems to make it worse.  Reports that hot and cold food seems to exacerbate the pain as well.  His prescription of carbamazepine is written to take 1 tablet in the morning, half a tablet midday and 1 tablet at bedtime.  He reports that he has been only taking 1 tablet in the morning and half a tablet at bedtime.  He remains on gabapentin at bedtime.  His MRI is scheduled for June 5.    REVIEW OF SYSTEMS: Out of a complete 14 system review of symptoms, the patient complains only of the following symptoms, and all other reviewed systems are negative.  See HPI  ALLERGIES: No Known Allergies  HOME MEDICATIONS: Outpatient Medications Prior to Visit  Medication Sig Dispense Refill   carbamazepine (TEGRETOL) 200 MG tablet Take 1 tablet (200 mg total) by mouth 3 (three) times daily. 270 tablet 3  cloNIDine (CATAPRES) 0.2 MG tablet Take 0.2 mg by mouth daily.     gabapentin (NEURONTIN) 400 MG capsule TAKE 1 CAPSULE BY MOUTH THREE TIMES A DAY 90 capsule 3   terbinafine (LAMISIL) 250 MG tablet Take 250 mg by mouth daily.  1   traZODone (DESYREL) 50 MG tablet Take 50 mg by mouth daily.  2   valsartan-hydrochlorothiazide (DIOVAN-HCT) 320-25 MG tablet Take 1 tablet by mouth daily.  2   No facility-administered medications prior to visit.    PAST MEDICAL HISTORY: Past Medical History:  Diagnosis Date   Hypertension     PAST SURGICAL HISTORY: Past Surgical History:  Procedure Laterality Date   HERNIA REPAIR     TOOTH EXTRACTION      FAMILY HISTORY: Family History   Problem Relation Age of Onset   Stroke Mother    Stroke Father    Kidney failure Brother    Kidney failure Brother     SOCIAL HISTORY: Social History   Socioeconomic History   Marital status: Married    Spouse name: Not on file   Number of children: Not on file   Years of education: Not on file   Highest education level: Not on file  Occupational History   Not on file  Tobacco Use   Smoking status: Never   Smokeless tobacco: Never  Vaping Use   Vaping Use: Never used  Substance and Sexual Activity   Alcohol use: Not Currently    Alcohol/week: 8.0 standard drinks of alcohol    Types: 8 Standard drinks or equivalent per week    Comment: "I stopped drinking" around January 2023   Drug use: Never   Sexual activity: Not on file  Other Topics Concern   Not on file  Social History Narrative   1 CUP OF COFFEE DAILY   RIGHT HANDED   Social Determinants of Health   Financial Resource Strain: Not on file  Food Insecurity: Not on file  Transportation Needs: Not on file  Physical Activity: Not on file  Stress: Not on file  Social Connections: Not on file  Intimate Partner Violence: Not on file      PHYSICAL EXAM  There were no vitals filed for this visit.    There is no height or weight on file to calculate BMI.  Generalized: Well developed, in no acute distress   Neurological examination  Mentation: Alert oriented to time, place, history taking. Follows all commands speech and language fluent Cranial nerve II-XII: Pupils were equal round reactive to light. Extraocular movements were full, visual field were full on confrontational test. Facial sensation and strength were normal. . Head turning and shoulder shrug  were normal and symmetric. Motor: The motor testing reveals 5 over 5 strength of all 4 extremities. Good symmetric motor tone is noted throughout.  Sensory: Sensory testing is intact to soft touch on all 4 extremities. No evidence of extinction is noted.   Coordination: Cerebellar testing reveals good finger-nose-finger and heel-to-shin bilaterally.  Gait and station: Gait is normal.  Reflexes: Deep tendon reflexes are symmetric and normal bilaterally.   DIAGNOSTIC DATA (LABS, IMAGING, TESTING) - I reviewed patient records, labs, notes, testing and imaging myself where available.  Lab Results  Component Value Date   WBC 8.9 04/10/2022   HGB 15.0 04/10/2022   HCT 46.5 04/10/2022   MCV 84 04/10/2022   PLT 205 04/10/2022      Component Value Date/Time   NA 140 04/10/2022 1537   K 4.2  04/10/2022 1537   CL 100 04/10/2022 1537   CO2 23 04/10/2022 1537   GLUCOSE 95 04/10/2022 1537   BUN 12 04/10/2022 1537   CREATININE 0.95 04/10/2022 1537   CALCIUM 9.6 04/10/2022 1537   PROT 6.7 04/10/2022 1537   ALBUMIN 4.4 04/10/2022 1537   AST 10 04/10/2022 1537   ALT 11 04/10/2022 1537   ALKPHOS 70 04/10/2022 1537   BILITOT <0.2 04/10/2022 1537   GFRNONAA 57 (L) 12/12/2020 1047   GFRAA 66 12/12/2020 1047    Lab Results  Component Value Date   HGBA1C 5.8 (H) 11/09/2018      ASSESSMENT AND PLAN 67 y.o. year old male  has a past medical history of Hypertension. here with:  1.  Trigeminal neuralgia  -Continue gabapentin 400 mg 3 times a day -Continue Tegretol 200 mg 3 times a day -Blood work in April was unremarkable we will consider repeating yearly -Advised if his symptoms worsen or he develops new symptoms he should let us know -Follow-up in 6 months or sooner if needed    Ward Givens, MSN, NP-C 02/04/2023, 5:20 PM [facility] [address], [place], [place] [address] [phone]

## 2023-02-05 ENCOUNTER — Encounter: Payer: Self-pay | Admitting: Adult Health

## 2023-02-05 ENCOUNTER — Ambulatory Visit (INDEPENDENT_AMBULATORY_CARE_PROVIDER_SITE_OTHER): Payer: Medicare Other | Admitting: Adult Health

## 2023-02-05 VITALS — BP 140/90 | HR 77 | Ht 70.0 in | Wt 214.0 lb

## 2023-02-05 DIAGNOSIS — G500 Trigeminal neuralgia: Secondary | ICD-10-CM

## 2023-02-05 DIAGNOSIS — Z5181 Encounter for therapeutic drug level monitoring: Secondary | ICD-10-CM

## 2023-02-05 MED ORDER — GABAPENTIN 100 MG PO CAPS: 100.0000 mg | ORAL_CAPSULE | Freq: Three times a day (TID) | ORAL | 1 refills | Status: DC

## 2023-02-05 NOTE — Patient Instructions (Signed)
Your Plan:  Continue carbamazepine 200 mg TID Increase gabapentin to total of 500 mg TID. Sent in 100 mg tablet to add to 400 mg tablet.  If your symptoms worsen or you develop new symptoms please let us know.    Thank you for coming to see [place] at [facility]. I hope we have been able to provide you high quality care today.  You may receive a patient satisfaction survey over the next few weeks. We would appreciate your feedback and comments so that we may continue to improve ourselves and the health of our patients.

## 2023-02-06 ENCOUNTER — Telehealth: Payer: Self-pay | Admitting: *Deleted

## 2023-02-06 LAB — CBC WITH DIFFERENTIAL/PLATELET
Basophils Absolute: 0 10*3/uL (ref 0.0–0.2)
Basos: 1 %
EOS (ABSOLUTE): 0.1 10*3/uL (ref 0.0–0.4)
Eos: 2 %
Hematocrit: 49.5 % (ref 37.5–51.0)
Hemoglobin: 16.5 g/dL (ref 13.0–17.7)
Immature Grans (Abs): 0 10*3/uL (ref 0.0–0.1)
Immature Granulocytes: 0 %
Lymphocytes Absolute: 2.5 10*3/uL (ref 0.7–3.1)
Lymphs: 29 %
MCH: 28.2 pg (ref 26.6–33.0)
MCHC: 33.3 g/dL (ref 31.5–35.7)
MCV: 85 fL (ref 79–97)
Monocytes Absolute: 0.8 10*3/uL (ref 0.1–0.9)
Monocytes: 10 %
Neutrophils Absolute: 5.2 10*3/uL (ref 1.4–7.0)
Neutrophils: 58 %
Platelets: 200 10*3/uL (ref 150–450)
RBC: 5.86 x10E6/uL — ABNORMAL HIGH (ref 4.14–5.80)
RDW: 14 % (ref 11.6–15.4)
WBC: 8.8 10*3/uL (ref 3.4–10.8)

## 2023-02-06 LAB — COMPREHENSIVE METABOLIC PANEL
ALT: 17 IU/L (ref 0–44)
AST: 16 IU/L (ref 0–40)
Albumin/Globulin Ratio: 2.1 (ref 1.2–2.2)
Albumin: 5 g/dL — ABNORMAL HIGH (ref 3.9–4.9)
Alkaline Phosphatase: 84 IU/L (ref 44–121)
BUN/Creatinine Ratio: 13 (ref 10–24)
BUN: 12 mg/dL (ref 8–27)
Bilirubin Total: 0.4 mg/dL (ref 0.0–1.2)
CO2: 24 mmol/L (ref 20–29)
Calcium: 9.6 mg/dL (ref 8.6–10.2)
Chloride: 99 mmol/L (ref 96–106)
Creatinine, Ser: 0.94 mg/dL (ref 0.76–1.27)
Globulin, Total: 2.4 g/dL (ref 1.5–4.5)
Glucose: 85 mg/dL (ref 70–99)
Potassium: 3.9 mmol/L (ref 3.5–5.2)
Sodium: 141 mmol/L (ref 134–144)
Total Protein: 7.4 g/dL (ref 6.0–8.5)
eGFR: 88 mL/min/{1.73_m2} (ref >59)

## 2023-02-06 LAB — CARBAMAZEPINE LEVEL, TOTAL: Carbamazepine (Tegretol), S: 6.4 ug/mL (ref 4.0–12.0)

## 2023-02-06 NOTE — Telephone Encounter (Signed)
Spoke to patient gave bloodwork results  Pt expressed understanding and thanked me for calling

## 2023-02-06 NOTE — Telephone Encounter (Signed)
-----   Message from Ward Givens, NP sent at 02/06/2023 12:55 PM EST ----- Blood work unremarkable

## 2023-03-08 ENCOUNTER — Other Ambulatory Visit: Payer: Self-pay | Admitting: Adult Health

## 2023-03-08 DIAGNOSIS — G500 Trigeminal neuralgia: Secondary | ICD-10-CM

## 2023-03-13 NOTE — Telephone Encounter (Signed)
Last seen by Ward Givens 02/05/23 Routing to provider to advise on refill. Please be advised taking two forms of gabapentin 100mg  and 400mg  TID  Prior Dispenses:   Dispensed Days Supply Quantity Provider Pharmacy  GABAPENTIN 100MG  CAP 03/08/2023 30 90 capsule Ward Givens, NP [id number] - [facility]...  GABAPENTIN 100 MG CAPSULE 02/05/2023 30 90 each Ward Givens, NP [id number] - [facility]...  GABAPENTIN 400 MG CAPSULE 01/25/2023 60 180 each Ward Givens, NP [id number] - [facility]...  GABAPENTIN 400 MG CAPSULE 12/14/2022 30 90 each Ward Givens, NP [id number] - [facility]...  GABAPENTIN 400 MG CAPSULE 11/05/2022 30 90 each Ward Givens, NP [id number] - [facility]...  GABAPENTIN 400 MG CAPSULE 09/28/2022 30 90 each Ward Givens, NP [id number] - [facility]...  GABAPENTIN 400 MG CAPSULE 08/25/2022 30 90 each Ward Givens, NP [id number] - [facility]...  GABAPENTIN 400 MG CAPSULE 07/19/2022 30 90 each Ward Givens, NP [id number] - [facility]...  GABAPENTIN 400 MG CAPSULE 06/19/2022 30 90 each Ward Givens, NP [id number] - [facility]...  GABAPENTIN 400 MG CAPSULE 05/12/2022 30 90 each Ward Givens, NP [id number] - [facility]...  GABAPENTIN 400 MG CAPSULE 04/15/2022 30 90 each Ward Givens, NP [id number]

## 2023-04-13 ENCOUNTER — Other Ambulatory Visit: Payer: Self-pay | Admitting: Adult Health

## 2023-06-24 ENCOUNTER — Other Ambulatory Visit: Payer: Self-pay | Admitting: Adult Health

## 2023-06-24 DIAGNOSIS — G500 Trigeminal neuralgia: Secondary | ICD-10-CM

## 2023-06-25 NOTE — Telephone Encounter (Signed)
Last seen 02/05/23 per note "Increase gabapentin 500 mg 3 times a day.  Order sent in for gabapentin 100 mg to add to  400 mg tablet for total of 500 mg a day F  Follow up scheduled on 09/08/23

## 2023-08-16 ENCOUNTER — Emergency Department (HOSPITAL_BASED_OUTPATIENT_CLINIC_OR_DEPARTMENT_OTHER): Payer: Medicare Other | Admitting: Radiology

## 2023-08-16 ENCOUNTER — Emergency Department (HOSPITAL_BASED_OUTPATIENT_CLINIC_OR_DEPARTMENT_OTHER)
Admission: EM | Admit: 2023-08-16 | Discharge: 2023-08-16 | Disposition: A | Discharge status: Home / Self Care | Payer: Medicare Other | Attending: Emergency Medicine | Admitting: Emergency Medicine

## 2023-08-16 ENCOUNTER — Encounter (HOSPITAL_BASED_OUTPATIENT_CLINIC_OR_DEPARTMENT_OTHER): Payer: Self-pay

## 2023-08-16 ENCOUNTER — Other Ambulatory Visit: Payer: Self-pay

## 2023-08-16 ENCOUNTER — Other Ambulatory Visit (HOSPITAL_BASED_OUTPATIENT_CLINIC_OR_DEPARTMENT_OTHER): Payer: Self-pay

## 2023-08-16 DIAGNOSIS — M25521 Pain in right elbow: Secondary | ICD-10-CM | POA: Diagnosis not present

## 2023-08-16 DIAGNOSIS — M25421 Effusion, right elbow: Secondary | ICD-10-CM | POA: Insufficient documentation

## 2023-08-16 DIAGNOSIS — W010XXA Fall on same level from slipping, tripping and stumbling without subsequent striking against object, initial encounter: Secondary | ICD-10-CM | POA: Insufficient documentation

## 2023-08-16 DIAGNOSIS — I10 Essential (primary) hypertension: Secondary | ICD-10-CM | POA: Insufficient documentation

## 2023-08-16 MED ORDER — HYDROCODONE-ACETAMINOPHEN 5-325 MG PO TABS
1.0000 | ORAL_TABLET | Freq: Once | ORAL | Status: AC
  Administered 2023-08-16: 1 via ORAL
  Filled 2023-08-16: qty 1

## 2023-08-16 MED ORDER — HYDROCODONE-ACETAMINOPHEN 5-325 MG PO TABS
1.0000 | ORAL_TABLET | Freq: Four times a day (QID) | ORAL | 0 refills | Status: AC | PRN
Start: 2023-08-16 — End: 2023-08-19
  Filled 2023-08-16: qty 12, 3d supply, fill #0

## 2023-08-16 NOTE — ED Triage Notes (Signed)
Sates trip and fell onto hands.  Complains  of right elbow pain.  Denies LOC

## 2023-08-16 NOTE — ED Notes (Signed)
Patient given discharge instructions. Questions were answered. Patient verbalized understanding of discharge instructions and care at home.  

## 2023-08-16 NOTE — ED Provider Notes (Signed)
[place] EMERGENCY DEPARTMENT AT [facility] Provider Note   CSN: [id number] Arrival date & time: 08/16/23  2725     History  Chief Complaint  Patient presents with   Arm Injury    Gary Prince is a 67 y.o. male with a history of hypertension who presents to the ED today after a fall.  Patient reports yesterday he tripped and fell.  He landed on his right outstretched hand and now complains of elbow pain.  He has iced the elbow and taken Advil without any relief.  He is also wearing a brace on his elbow without any relief.  Patient is still able to move elbow but just has pain with doing so.  Denies any numbness or loss of sensation. He states that he hit his head during the fall but it was not a hard hit.  No loss of consciousness or blood thinner use. Denies any vision changes or headaches.  No other concerns or complaints at this time.    Home Medications Prior to Admission medications   Medication Sig Start Date End Date Taking? Authorizing Provider  gabapentin (NEURONTIN) 100 MG capsule TAKE 1 CAPSULE (100 MG TOTAL) BY MOUTH 3 TIMES A DAY 06/25/23   Butch Penny, NP  gabapentin (NEURONTIN) 400 MG capsule TAKE 1 CAPSULE BY MOUTH THREE TIMES A DAY 06/25/23   Butch Penny, NP  HYDROcodone-acetaminophen (NORCO/VICODIN) 5-325 MG tablet Take 1 tablet by mouth every 6 (six) hours as needed for up to 3 days for severe pain. 08/16/23 08/19/23 Yes Maxwell Marion, PA-C  carbamazepine (TEGRETOL) 200 MG tablet Take 1 tablet (200 mg total) by mouth 3 (three) times daily. 10/17/22   Butch Penny, NP  cloNIDine (CATAPRES) 0.2 MG tablet Take 0.2 mg by mouth daily. 08/14/21   [provider]  terbinafine (LAMISIL) 250 MG tablet Take 250 mg by mouth daily. 10/07/18   [provider]  traZODone (DESYREL) 50 MG tablet Take 50 mg by mouth daily. 10/08/18   [provider]  valsartan-hydrochlorothiazide (DIOVAN-HCT) 320-25 MG tablet Take 1 tablet by mouth daily.  09/15/18   [provider]      Allergies    Patient has no known allergies.    Review of Systems   Review of Systems  Musculoskeletal:        Right elbow pain  All other systems reviewed and are negative.   Physical Exam Updated Vital Signs BP 128/89 (BP Location: Left Arm)   Pulse 63   Temp 98.2 F (36.8 C) (Oral)   Resp 16   Ht 5' 10.5" (1.791 m)   Wt 97.5 kg   SpO2 100%   BMI 30.41 kg/m  Physical Exam Vitals and nursing note reviewed.  Constitutional:      General: He is not in acute distress.    Appearance: Normal appearance.  HENT:     Head: Normocephalic and atraumatic.     Nose: Nose normal.     Mouth/Throat:     Mouth: Mucous membranes are moist.  Eyes:     Conjunctiva/sclera: Conjunctivae normal.     Pupils: Pupils are equal, round, and reactive to light.  Cardiovascular:     Rate and Rhythm: Normal rate and regular rhythm.     Pulses: Normal pulses.  Pulmonary:     Effort: Pulmonary effort is normal.     Breath sounds: Normal breath sounds.  Abdominal:     Palpations: Abdomen is soft.     Tenderness: There is no  abdominal tenderness.  Musculoskeletal:        General: Swelling and tenderness present. No deformity. Normal range of motion.     Comments: Tenderness and swelling to the right olecranon process.  Range of motion is intact.  Sensation and strength of the upper extremities appreciated.  Radial pulses palpable.  Skin:    General: Skin is warm and dry.     Findings: No rash.  Neurological:     General: No focal deficit present.     Mental Status: He is alert.  Psychiatric:        Mood and Affect: Mood normal.        Behavior: Behavior normal.     ED Results / Procedures / Treatments   Labs (all labs ordered are listed, but only abnormal results are displayed) Labs Reviewed - No data to display  EKG None  Radiology DG Elbow Complete Right  Result Date: 08/16/2023 CLINICAL DATA:  Fall. EXAM: RIGHT ELBOW - COMPLETE 3+ VIEW  COMPARISON:  None Available. FINDINGS: Extensive degenerative spurring is seen in the distal humerus and proximal ulna, potentially related to prior trauma. While no definite acute fracture is evident, the degree of bony spurring hinders assessment. Fat pad elevation is consistent with the presence of a joint effusion. IMPRESSION: 1. 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 2. No definite acute fracture is evident, but the degree of bony spurring hinders assessment. 3. Joint effusion. 4. If symptoms persist or worsen, consider CT or MR imaging follow-up to further evaluate. Electronically Signed   By: Kennith Center M.D.   On: 08/16/2023 10:57    Procedures Procedures: not indicated.   Medications Ordered in ED Medications  HYDROcodone-acetaminophen (NORCO/VICODIN) 5-325 MG per tablet 1 tablet (1 tablet Oral Given 08/16/23 1021)    ED Course/ Medical Decision Making/ A&P                                 Medical Decision Making Amount and/or Complexity of Data Reviewed Radiology: ordered.  Risk Prescription drug management.   This patient presents to the ED for concern of right elbow pain, this involves an extensive number of treatment options, and is a complaint that carries with it a high risk of complications and morbidity.   Differential diagnosis includes: Fracture, misalignment, contusion, ligamentous injury, arthritic changes, etc.   Comorbidities  See HPI above   Additional History  Additional history obtained from previous records.   Imaging Studies  I ordered imaging studies including right elbow x-ray I independently visualized and interpreted imaging which showed: Extensive degenerative spurring of the distal humerus and proximal ulna.  No acute fractures evident. Joint effusion present. I agree with the radiologist interpretation   Problem List / ED Course / Critical Interventions / Medication  Management  Right elbow pain I ordered medications including: Norco for pain Ice for swelling Reevaluation of the patient after these medicines showed that the patient improved I have reviewed the patients home medicines and have made adjustments as needed Splint provided prior to discharge.   Social Determinants of Health  Physical activity   Test / Admission - Considered  Discussed results with patient. He is hemodynamically stable and safe for discharge home. Information for orthopedics provided. Prescription for Norco sent to the pharmacy. Return precautions given.       Final Clinical Impression(s) / ED Diagnoses Final diagnoses:  Right elbow pain    Rx / DC Orders ED Discharge Orders          Ordered    HYDROcodone-acetaminophen (NORCO/VICODIN) 5-325 MG tablet  Every 6 hours PRN        08/16/23 1144              Maxwell Marion, PA-C 08/16/23 1145    Alvira Monday, MD 08/18/23 1114

## 2023-08-16 NOTE — Discharge Instructions (Addendum)
As discussed, your elbow x-ray showed degenerative changes as well as swelling around the elbow.  If the pain persists or worsens, further imaging is recommended evaluate for an occult, or hidden, fracture.  I have provided information for orthopedics.  Please give them a call on Monday to schedule an appointment for further evaluation. Please wear the splint until you are able to be evaluated by the specialist.  I have sent a prescription for several days worth of Norco to your pharmacy.  You can take this medication up to 4 times a day as needed for severe pain.  Otherwise you can alternate between ibuprofen and Tylenol every 4 hours as needed for pain.  Please ice the elbow when you are at rest.  Get help right away if: You cannot move the joint. Your fingers or toes tingle, become numb. or get cold and blue. You have a fever along with a joint that is red, warm, and swollen.

## 2023-08-17 HISTORY — PX: HAND SURGERY: SHX662

## 2023-08-31 ENCOUNTER — Other Ambulatory Visit: Payer: Self-pay | Admitting: Adult Health

## 2023-09-03 NOTE — Telephone Encounter (Signed)
Last seen on 02/05/23 Follow up scheduled on 09/08/23 Last filled on 07/30/23 #90 (30 day supply)

## 2023-09-07 NOTE — Progress Notes (Deleted)
PATIENT: Gary Prince DOB: Jun 19, 1956  REASON FOR VISIT: follow up HISTORY FROM: patient  No chief complaint on file.    HISTORY OF PRESENT ILLNESS: Today 09/07/23:  Gary Prince is a 67 y.o. male with a history of TGN . Returns today for follow-up.      02/05/23: Gary Prince is a 67 y.o. male with a history of Trigeminal Neuralgia. Returns today for follow-up.   Pain to palpitation in the left temporal region. This is the same pain that he has had. Describes it has a burning sensation. No visual changes. Eventually it will get better but has avoided touching area. Pain is the same despiste restarting gabpentin.    10/17/22: Gary Prince is a 67 year old male with a history of trigeminal neuralgia.  He returns today for follow-up.  Continues on gabapentin 300 mg 3 times a day and carbamazepine 200 mg 3 times a day.  He reports that as of now his pain is under good control.  Denies any new symptoms.  Tolerates medication well.  Will occasionally take trazodone at night if needed.  04/10/22: Gary Prince is a 67 year old male with a history of trigeminal neuralgia.  He returns today for follow-up.  He reports that last week he began having breakthrough pain however for the last three days no pain at all. Continues on gabapentin 300 mg TID and Carbamazepine 200 mg TID. Has trazodone at night but only takes it when he needs it.  Blood pressure is elevated today reports that he took himself off of his blood pressure medicine.  11/16/22[name] is a 67 year old male with a history of trigeminal neuralgia on the left side.  He returns today for follow-up.  Overall he continues to do well.  Remains on gabapentin and Tegretol.  States on occasion he may have breakthrough pain but this is not often.  He returns today for an evaluation.  10/31/20: Gary Prince is a 67 year old male with a history of trigeminal neuralgia on the left.  He returns today for follow-up.  He is currently taking  gabapentin 400 mg 3 times a day and Tegretol 200 mg 3 times a day.  He states that he has not noticed any pain in the left side of the face.  Overall he has been doing well.  He returns today for follow-up.  HISTORY 06/22/20:   Gary Prince is a 67 year old male with a history of trigeminal neuralgia on the left.  He returns today for follow-up.  We recently increased his gabapentin to 200 mg 3 times a day.  He reports that since the increase his facial pain has improved.  He continues on Tegretol.  He returns today for an evaluation.   HISTORY 05/09/20:   Gary Prince  is a 67 year old male with a history of trigeminal neuralgia on the left.  He returns today for follow-up.  He states that starting 3 weeks ago he began to have discomfort again in his face.  Reports that movement seems to make it worse.  Reports that hot and cold food seems to exacerbate the pain as well.  His prescription of carbamazepine is written to take 1 tablet in the morning, half a tablet midday and 1 tablet at bedtime.  He reports that he has been only taking 1 tablet in the morning and half a tablet at bedtime.  He remains on gabapentin at bedtime.  His MRI is scheduled for June 5.    REVIEW OF SYSTEMS: Out of  a complete 14 system review of symptoms, the patient complains only of the following symptoms, and all other reviewed systems are negative.  See HPI  ALLERGIES: No Known Allergies  HOME MEDICATIONS: Outpatient Medications Prior to Visit  Medication Sig Dispense Refill   gabapentin (NEURONTIN) 400 MG capsule TAKE 1 CAPSULE BY MOUTH THREE TIMES A DAY 180 capsule 1   carbamazepine (TEGRETOL) 200 MG tablet Take 1 tablet (200 mg total) by mouth 3 (three) times daily. 270 tablet 3   cloNIDine (CATAPRES) 0.2 MG tablet Take 0.2 mg by mouth daily.     gabapentin (NEURONTIN) 100 MG capsule TAKE 1 CAPSULE BY MOUTH THREE TIMES A DAY 90 capsule 0   terbinafine (LAMISIL) 250 MG tablet Take 250 mg by mouth daily.  1   traZODone  (DESYREL) 50 MG tablet Take 50 mg by mouth daily.  2   valsartan-hydrochlorothiazide (DIOVAN-HCT) 320-25 MG tablet Take 1 tablet by mouth daily.  2   No facility-administered medications prior to visit.    PAST MEDICAL HISTORY: Past Medical History:  Diagnosis Date   Hypertension     PAST SURGICAL HISTORY: Past Surgical History:  Procedure Laterality Date   HERNIA REPAIR     TOOTH EXTRACTION      FAMILY HISTORY: Family History  Problem Relation Age of Onset   Stroke Mother    Stroke Father    Kidney failure Brother    Kidney failure Brother     SOCIAL HISTORY: Social History   Socioeconomic History   Marital status: Married    Spouse name: Not on file   Number of children: Not on file   Years of education: Not on file   Highest education level: Not on file  Occupational History   Not on file  Tobacco Use   Smoking status: Never   Smokeless tobacco: Never  Vaping Use   Vaping status: Never Used  Substance and Sexual Activity   Alcohol use: Not Currently    Alcohol/week: 8.0 standard drinks of alcohol    Types: 8 Standard drinks or equivalent per week    Comment: "I stopped drinking" around January 2023   Drug use: Never   Sexual activity: Not on file  Other Topics Concern   Not on file  Social History Narrative   1 CUP OF COFFEE DAILY   RIGHT HANDED   Social Determinants of Health   Financial Resource Strain: Not on file  Food Insecurity: Not on file  Transportation Needs: Not on file  Physical Activity: Not on file  Stress: Not on file  Social Connections: Not on file  Intimate Partner Violence: Not on file      PHYSICAL EXAM  There were no vitals filed for this visit.   There is no height or weight on file to calculate BMI.  Generalized: Well developed, in no acute distress   Neurological examination  Mentation: Alert oriented to time, place, history taking. Follows all commands speech and language fluent Cranial nerve II-XII: Pupils  were equal round reactive to light. Extraocular movements were full, visual field were full on confrontational test. Facial sensation and strength were normal. . Head turning and shoulder shrug  were normal and symmetric. Motor: The motor testing reveals 5 over 5 strength of all 4 extremities. Good symmetric motor tone is noted throughout.  Sensory: Sensory testing is intact to soft touch on all 4 extremities. No evidence of extinction is noted.  Coordination: Cerebellar testing reveals good finger-nose-finger and heel-to-shin bilaterally.  Gait  and station: Gait is normal.  Reflexes: Deep tendon reflexes are symmetric and normal bilaterally.   DIAGNOSTIC DATA (LABS, IMAGING, TESTING) - I reviewed patient records, labs, notes, testing and imaging myself where available.  Lab Results  Component Value Date   WBC 8.8 02/05/2023   HGB 16.5 02/05/2023   HCT 49.5 02/05/2023   MCV 85 02/05/2023   PLT 200 02/05/2023      Component Value Date/Time   NA 141 02/05/2023 1439   K 3.9 02/05/2023 1439   CL 99 02/05/2023 1439   CO2 24 02/05/2023 1439   GLUCOSE 85 02/05/2023 1439   BUN 12 02/05/2023 1439   CREATININE 0.94 02/05/2023 1439   CALCIUM 9.6 02/05/2023 1439   PROT 7.4 02/05/2023 1439   ALBUMIN 5.0 (H) 02/05/2023 1439   AST 16 02/05/2023 1439   ALT 17 02/05/2023 1439   ALKPHOS 84 02/05/2023 1439   BILITOT 0.4 02/05/2023 1439   GFRNONAA 57 (L) 12/12/2020 1047   GFRAA 66 12/12/2020 1047    Lab Results  Component Value Date   HGBA1C 5.8 (H) 11/09/2018      ASSESSMENT AND PLAN 67 y.o. year old male  has a past medical history of Hypertension. here with:  1.  Trigeminal neuralgia  Increase gabapentin 500 mg 3 times a day.  Order sent in for gabapentin 100 mg to add to  400 mg tablet for total of 500 mg a day Continue Tegretol 200 mg 3 times a day Check blood work today Advised if his symptoms worsen or he develops new symptoms he should let us know Follow-up in 6 months or  sooner if needed    Butch Penny, MSN, NP-C 09/07/2023, 2:23 PM [facility] [address] [place], [place] [address] [phone]

## 2023-09-08 ENCOUNTER — Ambulatory Visit: Payer: Medicare Other | Admitting: Adult Health

## 2023-09-08 ENCOUNTER — Encounter: Payer: Self-pay | Admitting: Adult Health

## 2023-10-11 ENCOUNTER — Other Ambulatory Visit: Payer: Self-pay | Admitting: Adult Health

## 2023-10-13 NOTE — Telephone Encounter (Signed)
Rx refilled per last office visit note.

## 2023-10-23 ENCOUNTER — Ambulatory Visit: Payer: Medicare Other | Admitting: Adult Health

## 2023-12-15 ENCOUNTER — Other Ambulatory Visit: Payer: Self-pay | Admitting: Adult Health

## 2023-12-15 DIAGNOSIS — G500 Trigeminal neuralgia: Secondary | ICD-10-CM

## 2024-02-10 ENCOUNTER — Other Ambulatory Visit: Payer: Self-pay | Admitting: Adult Health

## 2024-02-10 NOTE — Telephone Encounter (Signed)
 Rx refilled.

## 2024-02-14 HISTORY — PX: DENTAL SURGERY: SHX609

## 2024-03-15 ENCOUNTER — Telehealth: Payer: Self-pay | Admitting: Adult Health

## 2024-03-15 NOTE — Telephone Encounter (Signed)
 I called pt and he states that he is having increased jaw pain with the trigeminal neuralgia.  Last seen 01/2023.  Made app tomorrow at 0830 for OV.  He will arrive 55.  He appreciated call.

## 2024-03-15 NOTE — Telephone Encounter (Signed)
 Pt complaining pain in jaw when try to eat patient said increase the dosage on carbamazepine (TEGRETOL) 200 MG tablet, but patient dizzy. Took last increased dosage this morning. Would like a call from the nurse.

## 2024-03-16 ENCOUNTER — Ambulatory Visit (INDEPENDENT_AMBULATORY_CARE_PROVIDER_SITE_OTHER): Admitting: Adult Health

## 2024-03-16 ENCOUNTER — Encounter: Payer: Self-pay | Admitting: Adult Health

## 2024-03-16 VITALS — BP 139/77 | HR 68 | Ht 70.0 in | Wt 229.6 lb

## 2024-03-16 DIAGNOSIS — Z5181 Encounter for therapeutic drug level monitoring: Secondary | ICD-10-CM

## 2024-03-16 DIAGNOSIS — G500 Trigeminal neuralgia: Secondary | ICD-10-CM

## 2024-03-16 MED ORDER — CARBAMAZEPINE 200 MG PO TABS: ORAL_TABLET | ORAL | 1 refills | Status: DC

## 2024-03-16 NOTE — Patient Instructions (Signed)
 Your Plan:  Continue gabapentin 500 mg three times a day  Increase carbamazepine to 1 tablet in the AM and noon. 1.5 tablet at bedtime.  Blood work today   Thank you for coming to see [place] at [facility]. I hope we have been able to provide you high quality care today.  You may receive a patient satisfaction survey over the next few weeks. We would appreciate your feedback and comments so that we may continue to improve ourselves and the health of our patients.

## 2024-03-16 NOTE — Progress Notes (Signed)
 PATIENT: Gary Prince DOB: Apr 05, 1956  REASON FOR VISIT: follow up HISTORY FROM: patient  Chief Complaint  Patient presents with   Room 5    Pt is here Alone. Pt states that things have been pretty good up until a couple of weeks ago,pt states that when he tries to eat her will have some pain. Pt states that he would like to know if he needs to look forward to an operation. Pt states that he would like to discuss his knee pain.      HISTORY OF PRESENT ILLNESS: Today 03/16/24:  Gary Prince is a 68 y.o. male with a history of trigeminal neuralgia on the left side. Returns today for follow-up.  Reports that at the beginning of the month he had all his teeth pulled and dentures put in.  He states that he began having pain on the left side of face 2 weeks ago.  He states that the pain is not as severe as it has been in the past.  He notices it when he eats, talking or laying on that side of the face.  He remains on gabapentin 500 mg 3 times a day.  He is also on carbamazepine 200 mg 3 times a day.  He does report that when the pain started he increased his morning dose of carbamazepine to 400 mg.  He states that it did help with the pain but it made him woozy.  He did have MRI of the face with focus on the trigeminal nerve in 2021.  I have reviewed this in epic.  MRI face/trigeminal nerve  IMPRESSION:    Unremarkable MRI face/trigeminal (with and without). No abnormal enhancing or compressive lesions.  02/04/23: Gary Prince is a 68 y.o. male with a history of Trigeminal Neuralgia. Returns today for follow-up.   Pain to palpitation in the left temporal region. This is the same pain that he has had. Describes it has a burning sensation. No visual changes. Eventually it will get better but has avoided touching area. Pain is the same despiste restarting gabpentin.    10/17/22: Gary Prince is a 68 year old male with a history of trigeminal neuralgia.  He returns today for follow-up.   Continues on gabapentin 300 mg 3 times a day and carbamazepine 200 mg 3 times a day.  He reports that as of now his pain is under good control.  Denies any new symptoms.  Tolerates medication well.  Will occasionally take trazodone at night if needed.  04/10/22: Gary Prince is a 68 year old male with a history of trigeminal neuralgia.  He returns today for follow-up.  He reports that last week he began having breakthrough pain however for the last three days no pain at all. Continues on gabapentin 300 mg TID and Carbamazepine 200 mg TID. Has trazodone at night but only takes it when he needs it.  Blood pressure is elevated today reports that he took himself off of his blood pressure medicine.  11/16/22[name] is a 68 year old male with a history of trigeminal neuralgia on the left side.  He returns today for follow-up.  Overall he continues to do well.  Remains on gabapentin and Tegretol.  States on occasion he may have breakthrough pain but this is not often.  He returns today for an evaluation.  10/31/20: Gary Prince is a 68 year old male with a history of trigeminal neuralgia on the left.  He returns today for follow-up.  He is currently taking gabapentin 400 mg  3 times a day and Tegretol 200 mg 3 times a day.  He states that he has not noticed any pain in the left side of the face.  Overall he has been doing well.  He returns today for follow-up.  HISTORY 06/22/20:   Gary Prince is a 68 year old male with a history of trigeminal neuralgia on the left.  He returns today for follow-up.  We recently increased his gabapentin to 200 mg 3 times a day.  He reports that since the increase his facial pain has improved.  He continues on Tegretol.  He returns today for an evaluation.   HISTORY 05/09/20:   Gary Prince  is a 68 year old male with a history of trigeminal neuralgia on the left.  He returns today for follow-up.  He states that starting 3 weeks ago he began to have discomfort again in his face.  Reports  that movement seems to make it worse.  Reports that hot and cold food seems to exacerbate the pain as well.  His prescription of carbamazepine is written to take 1 tablet in the morning, half a tablet midday and 1 tablet at bedtime.  He reports that he has been only taking 1 tablet in the morning and half a tablet at bedtime.  He remains on gabapentin at bedtime.  His MRI is scheduled for June 5.    REVIEW OF SYSTEMS: Out of a complete 14 system review of symptoms, the patient complains only of the following symptoms, and all other reviewed systems are negative.  See HPI  ALLERGIES: No Known Allergies  HOME MEDICATIONS: Outpatient Medications Prior to Visit  Medication Sig Dispense Refill   carbamazepine (TEGRETOL) 200 MG tablet TAKE 1 TABLET BY MOUTH 3 TIMES DAILY. 270 tablet 1   cloNIDine (CATAPRES) 0.2 MG tablet Take 0.2 mg by mouth daily.     gabapentin (NEURONTIN) 100 MG capsule TAKE 1 CAPSULE BY MOUTH UP TO 3 TIMES A DAY 90 capsule 0   gabapentin (NEURONTIN) 400 MG capsule TAKE 1 CAPSULE BY MOUTH THREE TIMES A DAY 180 capsule 1   terbinafine (LAMISIL) 250 MG tablet Take 250 mg by mouth daily.  1   traZODone (DESYREL) 50 MG tablet Take 50 mg by mouth daily.  2   valsartan-hydrochlorothiazide (DIOVAN-HCT) 320-25 MG tablet Take 1 tablet by mouth daily.  2   No facility-administered medications prior to visit.    PAST MEDICAL HISTORY: Past Medical History:  Diagnosis Date   Hypertension     PAST SURGICAL HISTORY: Past Surgical History:  Procedure Laterality Date   DENTAL SURGERY Bilateral 02/2024   HAND SURGERY Left 08/2023   HERNIA REPAIR     TOOTH EXTRACTION      FAMILY HISTORY: Family History  Problem Relation Age of Onset   Stroke Mother    Stroke Father    Kidney failure Brother    Kidney failure Brother     SOCIAL HISTORY: Social History   Socioeconomic History   Marital status: Married    Spouse name: Not on file   Number of children: Not on file    Years of education: Not on file   Highest education level: Not on file  Occupational History   Not on file  Tobacco Use   Smoking status: Never   Smokeless tobacco: Never  Vaping Use   Vaping status: Never Used  Substance and Sexual Activity   Alcohol use: Not Currently    Alcohol/week: 8.0 standard drinks of alcohol    Types:  8 Standard drinks or equivalent per week    Comment: "I stopped drinking" around January 2023   Drug use: Never   Sexual activity: Not on file  Other Topics Concern   Not on file  Social History Narrative   1 CUP OF COFFEE DAILY   RIGHT HANDED   Social Drivers of Health   Financial Resource Strain: Not on file  Food Insecurity: Not on file  Transportation Needs: Not on file  Physical Activity: Not on file  Stress: Not on file  Social Connections: Not on file  Intimate Partner Violence: Not on file      PHYSICAL EXAM  Vitals:   03/16/24 0836  BP: 139/77  Pulse: 68  Weight: 229 lb 9.6 oz (104.1 kg)  Height: 5\' 10"  (1.778 m)    Body mass index is 32.94 kg/m.  Generalized: Well developed, in no acute distress   Neurological examination  Mentation: Alert oriented to time, place, history taking. Follows all commands speech and language fluent Cranial nerve II-XII: Pupils were equal round reactive to light. Extraocular movements were full, visual field were full on confrontational test. Facial sensation and strength were normal. . Head turning and shoulder shrug  were normal and symmetric.  Discomfort noted with palpation around the left tragus Motor: The motor testing reveals 5 over 5 strength of all 4 extremities. Good symmetric motor tone is noted throughout.  Sensory: Sensory testing is intact to soft touch on all 4 extremities. No evidence of extinction is noted.  Coordination: Cerebellar testing reveals good finger-nose-finger and heel-to-shin bilaterally.  Gait and station: Gait is normal.  Reflexes: Deep tendon reflexes are symmetric  and normal bilaterally.   DIAGNOSTIC DATA (LABS, IMAGING, TESTING) - I reviewed patient records, labs, notes, testing and imaging myself where available.  Lab Results  Component Value Date   WBC 8.8 02/05/2023   HGB 16.5 02/05/2023   HCT 49.5 02/05/2023   MCV 85 02/05/2023   PLT 200 02/05/2023      Component Value Date/Time   NA 141 02/05/2023 1439   K 3.9 02/05/2023 1439   CL 99 02/05/2023 1439   CO2 24 02/05/2023 1439   GLUCOSE 85 02/05/2023 1439   BUN 12 02/05/2023 1439   CREATININE 0.94 02/05/2023 1439   CALCIUM 9.6 02/05/2023 1439   PROT 7.4 02/05/2023 1439   ALBUMIN 5.0 (H) 02/05/2023 1439   AST 16 02/05/2023 1439   ALT 17 02/05/2023 1439   ALKPHOS 84 02/05/2023 1439   BILITOT 0.4 02/05/2023 1439   GFRNONAA 57 (L) 12/12/2020 1047   GFRAA 66 12/12/2020 1047    Lab Results  Component Value Date   HGBA1C 5.8 (H) 11/09/2018      ASSESSMENT AND PLAN 68 y.o. year old male  has a past medical history of Hypertension. here with:  1.  Trigeminal neuralgia  Continue gabapentin 500 mg 3 times a day. Has 400 mg tablets and 100 mg tablets We will increase tegretol: Take Tegretol 200 mg in the AM and at noon. 300 mg at bedtime (1.5 tablets) Check blood work today Advised if his symptoms worsen or he develops new symptoms he should let us know Follow-up in 6 months or sooner if needed    Butch Penny, MSN, NP-C 03/16/2024, 8:42 AM [facility] [address], [address] [place], [place] [address] [phone]

## 2024-03-26 ENCOUNTER — Other Ambulatory Visit: Payer: Self-pay | Admitting: Adult Health

## 2024-04-20 ENCOUNTER — Ambulatory Visit: Admitting: Neurology

## 2024-05-19 ENCOUNTER — Other Ambulatory Visit: Payer: Self-pay | Admitting: Adult Health

## 2024-05-19 ENCOUNTER — Ambulatory Visit: Payer: Medicare Other | Admitting: Adult Health

## 2024-05-19 DIAGNOSIS — G500 Trigeminal neuralgia: Secondary | ICD-10-CM

## 2024-09-18 ENCOUNTER — Other Ambulatory Visit: Payer: Self-pay | Admitting: Adult Health

## 2024-09-18 DIAGNOSIS — G500 Trigeminal neuralgia: Secondary | ICD-10-CM

## 2024-09-21 ENCOUNTER — Telehealth: Payer: Self-pay | Admitting: Adult Health

## 2024-09-21 NOTE — Telephone Encounter (Signed)
 Patient called to confirm appointment.

## 2024-09-28 ENCOUNTER — Telehealth: Payer: Self-pay | Admitting: Adult Health

## 2024-09-28 ENCOUNTER — Ambulatory Visit: Admitting: Neurology

## 2024-09-28 NOTE — Telephone Encounter (Signed)
 Pt left vm at 8:38 stating he is unable to make appointment

## 2024-10-21 ENCOUNTER — Other Ambulatory Visit: Payer: Self-pay | Admitting: Adult Health

## 2024-10-26 ENCOUNTER — Other Ambulatory Visit: Payer: Self-pay | Admitting: Adult Health

## 2024-10-26 DIAGNOSIS — G500 Trigeminal neuralgia: Secondary | ICD-10-CM

## 2024-11-18 ENCOUNTER — Ambulatory Visit: Admitting: Neurology

## 2024-11-18 ENCOUNTER — Telehealth: Payer: Self-pay | Admitting: Neurology

## 2024-11-18 ENCOUNTER — Encounter: Payer: Self-pay | Admitting: Neurology

## 2024-11-18 VITALS — BP 113/69 | HR 73 | Ht 70.0 in | Wt 211.8 lb

## 2024-11-18 DIAGNOSIS — Z5181 Encounter for therapeutic drug level monitoring: Secondary | ICD-10-CM

## 2024-11-18 DIAGNOSIS — G500 Trigeminal neuralgia: Secondary | ICD-10-CM | POA: Diagnosis not present

## 2024-11-18 NOTE — Telephone Encounter (Signed)
 Referral to Neurosurgery sent thru [facility] to -[facility]UROS[facility] AT [facility]   -[facility] AT [place]  Phone: [phone]

## 2024-11-18 NOTE — Progress Notes (Signed)
 Subjective:    Patient ID: Gary Prince is a 68 y.o. male.  HPI    Interim history:   Gary Prince is a 68 year old right-handed gentleman with an underlying medical history of hypertension and mildly overweight state, who presents for follow-up consultation of his left-sided trigeminal neuralgia.  The patient is unaccompanied today and missed an appointment on 09/28/24. The patient has been followed on a regular basis by Duwaine Russell, Gary Prince, last seen in our clinic in April 2025, at which time he was advised to continue with gabapentin  500 mg 3 times daily and Tegretol  was increased to a total dose of 500 mg, in 2 doses.  He was supposed to have blood work at the time but did not have it done, I did not see any results from April 2025, previous blood test results from February 2024 were reviewed, carbamazepine  level at the time were 6.4, CBC unremarkable with the exception of mildly elevated red blood cell count of 5.86, CMP unremarkable with the exception of mildly elevated albumin level at 5.0.    Today, 11/18/2024: He reports that his pain has increased since he got dentures.  A few months ago he received full dentures.  The bottom dentures bother him especially when eating.  He does not typically keep them in when he eats.  He is interested in getting a surgical consultation.  He has never seen a neurosurgeon.  He has on his own increased his Tegretol  to 400 mg 3 times daily, continues to take gabapentin  500 mg 3 times daily total dose.  He is strongly advised not to self medicate and increase his medication as this can be dangerous and he is on the max dose of Tegretol  at this time.  He was supposed to take 200 mg in the morning and at lunch and 300 mg at bedtime for total dose of 700 mg.   The patient's allergies, current medications, family history, past medical history, past social history, past surgical history and problem list were reviewed and updated as appropriate.   Previously:    03/16/2024 Gary Russell, Gary Prince): << Gary Prince is a 68 y.o. male with a history of trigeminal neuralgia on the left side. Returns today for follow-up.  Reports that at the beginning of the month he had all his teeth pulled and dentures put in.  He states that he began having pain on the left side of face 2 weeks ago.  He states that the pain is not as severe as it has been in the past.  He notices it when he eats, talking or laying on that side of the face.  He remains on gabapentin  500 mg 3 times a day.  He is also on carbamazepine  200 mg 3 times a day.  He does report that when the pain started he increased his morning dose of carbamazepine  to 400 mg.  He states that it did help with the pain but it made him woozy.  He did have MRI of the face with focus on the trigeminal nerve in 2021.  I have reviewed this in epic.   MRI face/trigeminal nerve   IMPRESSION:    Unremarkable MRI face/trigeminal (with and without). No abnormal enhancing or compressive lesions. >>   02/04/23 Gary Russell, Gary Prince): << Gary Prince is a 68 y.o. male with a history of Trigeminal Neuralgia. Returns today for follow-up.    Pain to palpitation in the left temporal region. This is the same pain that he has  had. Describes it has a burning sensation. No visual changes. Eventually it will get better but has avoided touching area. Pain is the same despiste restarting gabpentin. >>      10/17/22 Gary Russell, Gary Prince): << Gary Prince is a 68 year old male with a history of trigeminal neuralgia.  He returns today for follow-up.  Continues on gabapentin  300 mg 3 times a day and carbamazepine  200 mg 3 times a day.  He reports that as of now his pain is under good control.  Denies any new symptoms.  Tolerates medication well.  Will occasionally take trazodone at night if needed. >>   04/10/22 Gary Russell, Gary Prince): << Gary Prince is a 68 year old male with a history of trigeminal neuralgia.  He returns today for follow-up.  He reports  that last week he began having breakthrough pain however for the last three days no pain at all. Continues on gabapentin  300 mg TID and Carbamazepine  200 mg TID. Has trazodone at night but only takes it when he needs it.  Blood pressure is elevated today reports that he took himself off of his blood pressure medicine. >>   10/31/21 Gary Russell, Gary Prince): <<Gary Prince is a 68 year old male with a history of trigeminal neuralgia on the left side.  He returns today for follow-up.  Overall he continues to do well.  Remains on gabapentin  and Tegretol .  States on occasion he may have breakthrough pain but this is not often.  He returns today for an evaluation. >>   10/31/20 Gary Russell, Gary Prince): << Gary Prince is a 68 year old male with a history of trigeminal neuralgia on the left.  He returns today for follow-up.  He is currently taking gabapentin  400 mg 3 times a day and Tegretol  200 mg 3 times a day.  He states that he has not noticed any pain in the left side of the face.  Overall he has been doing well.  He returns today for follow-up.>>    06/22/20 Gary Russell, Gary Prince): << Gary Prince is a 69 year old male with a history of trigeminal neuralgia on the left.  He returns today for follow-up.  We recently increased his gabapentin  to 200 mg 3 times a day.  He reports that since the increase his facial pain has improved.  He continues on Tegretol .  He returns today for an evaluation.>>   05/09/2020 Gary Russell, Gary Prince): <<Gary Prince  is a 68 year old male with a history of trigeminal neuralgia on the left.  He returns today for follow-up.  He states that starting 3 weeks ago he began to have discomfort again in his face.  Reports that movement seems to make it worse.  Reports that hot and cold food seems to exacerbate the pain as well.  His prescription of carbamazepine  is written to take 1 tablet in the morning, half a tablet midday and 1 tablet at bedtime.  He reports that he has been only taking 1 tablet in the  morning and half a tablet at bedtime.  He remains on gabapentin  at bedtime.  His MRI is scheduled for June 5.   >>  02/15/2019: I first met him on 11/09/2018 at the request of his dentist, at which time the patient reported left upper facial pain for the past 2 months or so. He had a recent left upper molar extraction in September 2019. I suggested we proceed with blood work, a trigeminal MRI and a trial of Tegretol . Blood work showed benign findings, including CRP, CBC, ESR, CMP, A1c however  was borderline at 5.8. We talked to him about his blood test results via phone call. He did not proceed with a trigeminal MRI.   He reports that he could not afford the MRI. He would be willing to try to pursue it at this time. He had a flareup in his left facial pain in or around Christmas. He has been taking Tegretol  200 mg twice a day generic. He reports no significant side effects but has noted a little bit of tiredness from it. He reports no issues driving. He avoids long-distance driving. He is supposed to see his dentist for additional dental work as I understand. Other than that, he has done well with the carbamazepine . He would like to continue with it.    The patient's allergies, current medications, family history, past medical history, past social history, past surgical history and problem list were reviewed and updated as appropriate.    Previously:    11/09/2018: (He) reports left upper facial pain for the past 2 months or so. He had a left upper maxilla molar tooth extraction under his dentist in September 2019. After his local anesthetic wore off he started having sharp pain in the left upper face and jaw area. Triggers included touching the area. He went back to his dentist and was treated symptomatically with ibuprofen 800 mg strength. He also had an additional procedure done to the underlying bone. His pain returned, was not as severe as originally. Nevertheless, he has ongoing sharp and shooting  pains in the left mid face area, seems to radiate sideways towards the ear and upwards. Triggers include touch, toothbrushing, sometimes talking and chewing. He's had a course of steroid 48-year-old office. He did not find relief from this. The pain is intermittent, not constant. He has other teeth that need attention secondary to caries, he was told that there was no underlying bone disease or infection per x-rays. He does not have a history of headaches, he has no other neurological accompaniments. He is married and lives with his wife, he works at a daycare. He does not smoke, drinks alcohol about 8 drinks per week on average, drinks caffeine in the form of coffee, one cup per day on average. He tries to stay hydrated with water. He has a history of chronic knee pain from playing sports in high school. He has never seen an orthopedic specialist for this.   His Past Medical History Is Significant For: Past Medical History:  Diagnosis Date   Hypertension     His Past Surgical History Is Significant For: Past Surgical History:  Procedure Laterality Date   DENTAL SURGERY Bilateral 02/2024   HAND SURGERY Left 08/2023   HERNIA REPAIR     TOOTH EXTRACTION      His Family History Is Significant For: Family History  Problem Relation Age of Onset   Stroke Mother    Stroke Father    Kidney failure Brother    Kidney failure Brother     His Social History Is Significant For: Social History   Socioeconomic History   Marital status: Married    Spouse name: Not on file   Number of children: Not on file   Years of education: Not on file   Highest education level: Not on file  Occupational History   Not on file  Tobacco Use   Smoking status: Never   Smokeless tobacco: Never  Vaping Use   Vaping status: Never Used  Substance and Sexual Activity   Alcohol use:  Not Currently    Alcohol/week: 8.0 standard drinks of alcohol    Types: 8 Standard drinks or equivalent per week    Comment: I  stopped drinking around January 2023   Drug use: Never   Sexual activity: Not on file  Other Topics Concern   Not on file  Social History Narrative   1 CUP OF COFFEE DAILY   RIGHT HANDED   Social Drivers of Health   Financial Resource Strain: Not on file  Food Insecurity: Not on file  Transportation Needs: Not on file  Physical Activity: Not on file  Stress: Not on file  Social Connections: Not on file    His Allergies Are:  No Known Allergies:   His Current Medications Are:  Outpatient Encounter Medications as of 11/18/2024  Medication Sig   carbamazepine  (TEGRETOL ) 200 MG tablet TAKE 1 TABLET IN THE AM AND AT NOON. TAKE 1.5 TABLETS AT BEDTIME (Patient taking differently: 3 (three) times daily. Take 1 tablet in the AM and at noon. Take 1.5 tablets at bedtime)   cloNIDine (CATAPRES) 0.2 MG tablet Take 0.2 mg by mouth daily.   gabapentin  (NEURONTIN ) 100 MG capsule TAKE 1 CAPSULE BY MOUTH UP TO 3 TIMES A DAY   gabapentin  (NEURONTIN ) 400 MG capsule TAKE 1 CAPSULE BY MOUTH UP TO 3 TIMES A DAY   terbinafine (LAMISIL) 250 MG tablet Take 250 mg by mouth daily.   traZODone (DESYREL) 50 MG tablet Take 50 mg by mouth daily.   valsartan-hydrochlorothiazide (DIOVAN-HCT) 320-25 MG tablet Take 1 tablet by mouth daily.   No facility-administered encounter medications on file as of 11/18/2024.  :  Review of Systems:  Out of a complete 14 point review of systems, all are reviewed and negative with the exception of these symptoms as listed below:  Review of Systems  Objective:  Neurological Exam  Physical Exam Physical Examination:   Vitals:   11/18/24 1314  BP: 113/69  Pulse: 73    General Examination: The patient is a very pleasant 68 y.o. male in no acute distress. He appears well-developed and well-nourished and well groomed.   HEENT: Normocephalic, atraumatic, pupils are equal, round and reactive to light, tracking is well-preserved, no obvious nystagmus noted. Normal smooth  pursuit is noted. Hearing is grossly intact. Face is symmetric with normal facial animation. Speech is clear with no dysarthria noted. There is no hypophonia. There is no lip, neck/head, jaw or voice tremor. Neck with FROM. Oropharynx exam reveals: mild to moderate mouth dryness, dentures in place.  Tongue protrudes centrally and palate elevates symmetrically.    Chest: Clear to auscultation without wheezing, rhonchi or crackles noted.   Heart: S1+S2+0, regular and normal without murmurs, rubs or gallops noted.    Abdomen: Soft, non-tender and non-distended.   Extremities: There is no obvious swelling in the distal lower extremities bilaterally.    Skin: Warm and dry without trophic changes noted.   Musculoskeletal: exam reveals no obvious joint deformities.    Neurologically:  Mental status: The patient is awake, alert and oriented in all 4 spheres. His immediate and remote memory, attention, language skills and fund of knowledge are appropriate. There is no evidence of aphasia, agnosia, apraxia or anomia. Speech is clear with normal prosody and enunciation. Thought process is linear. Mood is normal and affect is normal.  Cranial nerves II - XII are as described above under HEENT exam.  Motor exam: Normal bulk, moving all 4 extremities without restriction, no obvious action or resting tremor.  Fine motor skills and coordination: intact grossly.  Cerebellar testing: No dysmetria or intention tremor.  Sensory exam: intact to light touch.   Gait, station and balance: He stands easily. No veering to one side is noted. No leaning to one side is noted. Posture is age-appropriate and stance is narrow based. Gait shows normal stride length and normal pace. No problems turning are noted.     Assessment and Plan:    In summary, Gary Prince is a 68 year old right-handed gentleman with an underlying medical history of hypertension and mildly overweight state, who presents for follow-up consultation  of his left-sided trigeminal neuralgia.  He has increased his carbamazepine  on his own to a total dose of 1200 mg a few months ago.  He is strongly advised not to increase his medication on his own.  I suggested we scaled back down to a total dose of 800 mg by taking 200 mg in the morning and at lunch and 400 mg at bedtime, continue with gabapentin  total dose 500 mg 3 times daily.  We mutually agreed to pursue a surgical consultation with neurosurgery.  I have placed a referral.  He may be a candidate for a gamma knife intervention.  He is interested in getting evaluated.  He is advised to stay well-hydrated and get some routine blood work done today including CMP, CBC with differential and carbamazepine  level.  We will call him with the results and he is advised to follow-up routinely to see the nurse practitioner in this clinic in about 8 or 9 months.  I answered all his questions today and he was in agreement with our plan.   I spent 30 minutes in total face-to-face time and in reviewing records during pre-charting, more than 50% of which was spent in counseling and coordination of care, reviewing test results, reviewing medications and treatment regimen and/or in discussing or reviewing the diagnosis of TGN, the prognosis and treatment options. Pertinent laboratory and imaging test results that were available during this visit with the patient were reviewed by me and considered in my medical decision making (see chart for details).

## 2024-11-19 ENCOUNTER — Encounter: Payer: Self-pay | Admitting: Neurosurgery

## 2024-11-19 ENCOUNTER — Ambulatory Visit: Admitting: Neurosurgery

## 2024-11-19 VITALS — BP 130/88 | HR 78 | Temp 98.0°F | Ht 70.5 in | Wt 215.0 lb

## 2024-11-19 DIAGNOSIS — G500 Trigeminal neuralgia: Secondary | ICD-10-CM

## 2024-11-19 LAB — CBC WITH DIFFERENTIAL/PLATELET
Basophils Absolute: 0.1 x10E3/uL (ref 0.0–0.2)
Basos: 1 %
EOS (ABSOLUTE): 0.2 x10E3/uL (ref 0.0–0.4)
Eos: 2 %
Hematocrit: 48.2 % (ref 37.5–51.0)
Hemoglobin: 15.7 g/dL (ref 13.0–17.7)
Immature Grans (Abs): 0 x10E3/uL (ref 0.0–0.1)
Immature Granulocytes: 0 %
Lymphocytes Absolute: 2.8 x10E3/uL (ref 0.7–3.1)
Lymphs: 33 %
MCH: 28.4 pg (ref 26.6–33.0)
MCHC: 32.6 g/dL (ref 31.5–35.7)
MCV: 87 fL (ref 79–97)
Monocytes Absolute: 0.6 x10E3/uL (ref 0.1–0.9)
Monocytes: 8 %
Neutrophils Absolute: 4.9 x10E3/uL (ref 1.4–7.0)
Neutrophils: 56 %
Platelets: 232 x10E3/uL (ref 150–450)
RBC: 5.52 x10E6/uL (ref 4.14–5.80)
RDW: 13.9 % (ref 11.6–15.4)
WBC: 8.5 x10E3/uL (ref 3.4–10.8)

## 2024-11-19 LAB — COMPREHENSIVE METABOLIC PANEL WITH GFR
ALT: 13 IU/L (ref 0–44)
AST: 14 IU/L (ref 0–40)
Albumin: 4.9 g/dL (ref 3.9–4.9)
Alkaline Phosphatase: 74 IU/L (ref 47–123)
BUN/Creatinine Ratio: 14 (ref 10–24)
BUN: 18 mg/dL (ref 8–27)
Bilirubin Total: 0.3 mg/dL (ref 0.0–1.2)
CO2: 24 mmol/L (ref 20–29)
Calcium: 9.4 mg/dL (ref 8.6–10.2)
Chloride: 98 mmol/L (ref 96–106)
Creatinine, Ser: 1.28 mg/dL — ABNORMAL HIGH (ref 0.76–1.27)
Globulin, Total: 2.5 g/dL (ref 1.5–4.5)
Glucose: 83 mg/dL (ref 70–99)
Potassium: 4.4 mmol/L (ref 3.5–5.2)
Sodium: 138 mmol/L (ref 134–144)
Total Protein: 7.4 g/dL (ref 6.0–8.5)
eGFR: 60 mL/min/1.73 (ref >59)

## 2024-11-19 LAB — CARBAMAZEPINE LEVEL, TOTAL: Carbamazepine (Tegretol), S: 13.1 ug/mL (ref 4.0–12.0)

## 2024-11-19 NOTE — Progress Notes (Unsigned)
 Assessment : Discussed the use of AI scribe software for clinical note transcription with the patient, who gave verbal consent to proceed.  History of Present Illness Gary Prince is a 68 year old male with chronic facial pain who presents with persistent pain despite medication. He was referred by a dental specialist for further evaluation of persistent facial pain.  The facial pain began three years ago following a tooth extraction. Initially attributed to the dental procedure, he returned to the dentist who suspected a retained tooth fragment. Despite interventions, the pain persisted, leading to a referral to a dental specialist who identified the issue, but no further treatment was effective.  The pain is localized to the left side of the face, primarily in the lower jaw area, and does not radiate forward or upward. It is exacerbated by eating and, at one point, by washing his face. The pain is described as a 'burning shooting pain' and occurs primarily when eating on the affected side. He avoids eating on that side to prevent pain.  Two years ago, he consulted a neurologist and was started on carbamazepine , which initially helped alleviate the facial pain. Gabapentin  was later added to his regimen when the pain persisted. The medications have been effective in managing the pain, but he notes that if the medication effect wanes, the pain returns. He remains on these medications to control the symptoms.  The pain has not significantly limited his daily activities, such as cutting grass or household chores, but he must pause activities when the pain is triggered. He is retired from agricultural consultant and lives with his wife, daughter, and grandchildren. No heart or lung problems. Reports pain when talking.    Plan : Number surgery here that this 68 year old gentleman for the past few years has had this pain at the angle of the LEFT  jaw where his tooth extraction was.  What he now describes as pain  is primarily located in that part of the face but it rarely radiates to his nose or his cheek but he does state that the angle of his nose is sensitive.  Regardless, this responded very favorably to carbamazepine  and gabapentin  but unfortunately the positive effects have waned.  I would like to get an MRI of the brain with fiesta sequences and have noted that in the request to be able to see whether there is any compression at the root entry zone by an offending vessel.  I explained to her briefly why this is important and that I would like for him to have this image sequence done and then return with his family so that we can review these together and talk about the options.  He will get this MRI done and I will see him back.   Social History   Socioeconomic History   Marital status: Married    Spouse name: Zelda   Number of children: 4   Years of education: Not on file   Highest education level: Not on file  Occupational History   Not on file  Tobacco Use   Smoking status: Never   Smokeless tobacco: Never  Vaping Use   Vaping status: Never Used  Substance and Sexual Activity   Alcohol use: Not Currently    Alcohol/week: 8.0 standard drinks of alcohol    Types: 8 Standard drinks or equivalent per week    Comment: I stopped drinking around January 2023   Drug use: Never   Sexual activity: Not Currently  Other Topics Concern  Not on file  Social History Narrative   1 CUP OF COFFEE DAILY   RIGHT HANDED   Social Drivers of Health   Financial Resource Strain: Not on file  Food Insecurity: Not on file  Transportation Needs: Not on file  Physical Activity: Not on file  Stress: Not on file  Social Connections: Not on file  Intimate Partner Violence: Not on file    Family History  Problem Relation Age of Onset   Stroke Mother    Stroke Father    Kidney failure Brother    Kidney failure Brother     No Known Allergies  Past Medical History:  Diagnosis Date    Hypertension     Past Surgical History:  Procedure Laterality Date   DENTAL SURGERY Bilateral 02/2024   HAND SURGERY Left 08/2023   HERNIA REPAIR     TOOTH EXTRACTION       Physical Exam   Physical Exam HENT:     Head: Normocephalic.     Nose: Nose normal.  Eyes:     Pupils: Pupils are equal, round, and reactive to light.  Cardiovascular:     Rate and Rhythm: Normal rate.  Pulmonary:     Effort: Pulmonary effort is normal.  Abdominal:     General: Abdomen is flat.  Musculoskeletal:     Cervical back: Normal range of motion.  Neurological:     Mental Status: Patient is alert.     Cranial Nerves: Cranial nerves 2-12 are intact.     Sensory: Sensation is intact.     Motor: Motor function is intact.     Coordination: Coordination is intact.     No results found for this or any previous visit.

## 2024-11-22 ENCOUNTER — Ambulatory Visit: Payer: Self-pay | Admitting: Neurology

## 2024-11-22 NOTE — Telephone Encounter (Signed)
 LMVM for wife, at her # to have pt call back.  his # no answer.

## 2024-11-22 NOTE — Telephone Encounter (Signed)
-----   Message from True Mar sent at 11/22/2024 11:01 AM EST ----- Please advise patient that his carbamazepine  level is elevated above therapeutic range.  As discussed, we are reducing the dose.  He is again reminded not to increase the dose on his own as this can  be potentially dangerous.  If he has any symptoms such as acute onset of tremor, dizziness, sleepiness, or coordination problems, I recommend he proceed to the emergency room immediately by calling  911 or have someone take him.  His kidney function is also impaired, I recommend he follow-up with his primary care for this.  We do not have that many values to compare it with so he would have to  follow-up with PCP on this. ----- Message ----- From: Interface, [facility] Lab Results In Sent: 11/19/2024   7:38 AM EST To: True Mar, MD

## 2024-11-23 NOTE — Telephone Encounter (Signed)
-----   Message from True Mar sent at 11/22/2024 11:01 AM EST ----- Please advise patient that his carbamazepine  level is elevated above therapeutic range.  As discussed, we are reducing the dose.  He is again reminded not to increase the dose on his own as this can  be potentially dangerous.  If he has any symptoms such as acute onset of tremor, dizziness, sleepiness, or coordination problems, I recommend he proceed to the emergency room immediately by calling  911 or have someone take him.  His kidney function is also impaired, I recommend he follow-up with his primary care for this.  We do not have that many values to compare it with so he would have to  follow-up with PCP on this. ----- Message ----- From: Interface, [facility] Lab Results In Sent: 11/19/2024   7:38 AM EST To: True Mar, MD

## 2024-11-23 NOTE — Telephone Encounter (Signed)
 No answer(pt)

## 2024-11-30 NOTE — Telephone Encounter (Signed)
 After multiple attempts to call pt.  No answer today.  I have mailed results along with result note to pts home address.

## 2024-11-30 NOTE — Telephone Encounter (Signed)
-----   Message from True Mar, MD sent at 11/22/2024 11:01 AM EST ----- Please advise patient that his carbamazepine  level is elevated above therapeutic range.  As discussed, we are reducing the dose.  He is again reminded not to increase the dose on his own as this can  be potentially dangerous.  If he has any symptoms such as acute onset of tremor, dizziness, sleepiness, or coordination problems, I recommend he proceed to the emergency room immediately by calling  911 or have someone take him.  His kidney function is also impaired, I recommend he follow-up with his primary care for this.  We do not have that many values to compare it with so he would have to  follow-up with PCP on this. ----- Message ----- From: Interface, [facility] Lab Results In Sent: 11/19/2024   7:38 AM EST To: True Mar, MD

## 2024-12-24 ENCOUNTER — Other Ambulatory Visit: Payer: Self-pay | Admitting: Adult Health

## 2024-12-24 ENCOUNTER — Ambulatory Visit: Admitting: Neurosurgery

## 2024-12-24 DIAGNOSIS — G500 Trigeminal neuralgia: Secondary | ICD-10-CM

## 2024-12-24 NOTE — Telephone Encounter (Signed)
 Last seen on 11/18/24 Follow up scheduled on 09/12/25

## 2024-12-28 ENCOUNTER — Inpatient Hospital Stay: Admission: RE | Admit: 2024-12-28 | Discharge: 2024-12-28 | Attending: Neurosurgery | Admitting: Neurosurgery

## 2024-12-28 DIAGNOSIS — G500 Trigeminal neuralgia: Secondary | ICD-10-CM

## 2024-12-31 ENCOUNTER — Ambulatory Visit: Admitting: Neurosurgery

## 2025-01-04 ENCOUNTER — Ambulatory Visit: Admitting: Neurosurgery

## 2025-01-04 ENCOUNTER — Encounter: Payer: Self-pay | Admitting: Neurosurgery

## 2025-01-04 VITALS — BP 164/104 | HR 59 | Temp 98.0°F | Ht 70.5 in | Wt 228.8 lb

## 2025-01-04 DIAGNOSIS — G500 Trigeminal neuralgia: Secondary | ICD-10-CM | POA: Diagnosis not present

## 2025-01-04 NOTE — Progress Notes (Signed)
 69 year old gentleman with left-sided trigeminal neuralgia in the V2 and V3 distribution who came and saw us  because he had been maximized on medical management.  We obtained an MRI and this indeed shows that there is vessel compression at the root entry zone on the left.  Today he came in unfortunately he did not bring his wife with him.  I told him that I would like for his spouse to be here in order to discuss the options as he wants to have something done about the pain.  We decided that he is gena come back next week with his wife and we discussed the surgical option.

## 2025-01-05 ENCOUNTER — Other Ambulatory Visit: Payer: Self-pay | Admitting: Adult Health

## 2025-01-11 ENCOUNTER — Ambulatory Visit: Admitting: Neurosurgery

## 2025-01-14 ENCOUNTER — Ambulatory Visit: Admitting: Neurosurgery

## 2025-01-14 ENCOUNTER — Other Ambulatory Visit: Payer: Self-pay | Admitting: Adult Health

## 2025-01-14 ENCOUNTER — Encounter: Payer: Self-pay | Admitting: Neurosurgery

## 2025-01-14 VITALS — BP 137/76 | HR 78 | Temp 98.5°F | Ht 70.5 in | Wt 225.8 lb

## 2025-01-14 DIAGNOSIS — G500 Trigeminal neuralgia: Secondary | ICD-10-CM

## 2025-01-14 NOTE — Progress Notes (Signed)
 69 year old gentleman with trigeminal neuralgia who came back with his MRI but his wife does not there with him.  Therefore he returns with his wife today and I went over the images with him.  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  We talked about the options of continued medical management versus percutaneous treatment like balloon rhizotomy and the option of microvascular decompression.  I went over the risks and benefits of all 3 options with him.  I told him about the high recurrence rate with the balloon rhizotomy with the trade off being the minimally invasive nature of it.  He says that he does not want to have this pain come back soon and opted for the microvascular decompression.  I went over the surgery in great detail with them and talked about the risk of infection, bleeding, CSF leak, stroke, paralysis and death among others.  In particular I told him that sometimes patients can have some postoperative numbness but there is a 20% risk over 10 years of recurrence and numbness is a protective mechanism for this recurrence.  We talked about the postoperative course and I talked to them about taking stool softeners and we went over the perioperative course in the ICU and discharged home the next day.  I told him that he would be on pain medication and muscle relaxers as well.  After discussing these options, he requested for us  to proceed with a left sided microvascular decompression.

## 2025-01-20 ENCOUNTER — Other Ambulatory Visit: Payer: Self-pay

## 2025-01-20 DIAGNOSIS — G500 Trigeminal neuralgia: Secondary | ICD-10-CM

## 2025-01-27 ENCOUNTER — Ambulatory Visit: Admitting: Neurology

## 2025-01-31 ENCOUNTER — Inpatient Hospital Stay (HOSPITAL_COMMUNITY): Admit: 2025-01-31 | Admitting: Neurosurgery

## 2025-02-15 ENCOUNTER — Encounter: Admitting: Neurosurgery

## 2025-09-12 ENCOUNTER — Ambulatory Visit: Admitting: Adult Health
